# Patient Record
Sex: Male | Born: 1985 | Race: Black or African American | Hispanic: No | Marital: Single | Smoking: Current every day smoker
Health system: Southern US, Community
[De-identification: ages and names within clinical notes are randomized; demographics above are authoritative.]

## PROBLEM LIST (undated history)

## (undated) DIAGNOSIS — K859 Acute pancreatitis without necrosis or infection, unspecified: Secondary | ICD-10-CM

## (undated) DIAGNOSIS — R109 Unspecified abdominal pain: Secondary | ICD-10-CM

## (undated) DIAGNOSIS — Z789 Other specified health status: Secondary | ICD-10-CM

## (undated) DIAGNOSIS — G8929 Other chronic pain: Secondary | ICD-10-CM

## (undated) HISTORY — PX: NO PAST SURGERIES: SHX2092

---

## 2013-07-19 ENCOUNTER — Emergency Department (HOSPITAL_COMMUNITY): Payer: Self-pay

## 2013-07-19 ENCOUNTER — Inpatient Hospital Stay (HOSPITAL_COMMUNITY)
Admission: EM | Admit: 2013-07-19 | Discharge: 2013-07-22 | DRG: 440 | Disposition: A | Discharge status: Home / Self Care | Payer: Self-pay | Attending: Internal Medicine | Admitting: Internal Medicine

## 2013-07-19 ENCOUNTER — Encounter (HOSPITAL_COMMUNITY): Payer: Self-pay | Admitting: Emergency Medicine

## 2013-07-19 DIAGNOSIS — K5900 Constipation, unspecified: Secondary | ICD-10-CM | POA: Diagnosis present

## 2013-07-19 DIAGNOSIS — R7309 Other abnormal glucose: Secondary | ICD-10-CM | POA: Diagnosis present

## 2013-07-19 DIAGNOSIS — IMO0001 Reserved for inherently not codable concepts without codable children: Secondary | ICD-10-CM

## 2013-07-19 DIAGNOSIS — R030 Elevated blood-pressure reading, without diagnosis of hypertension: Secondary | ICD-10-CM | POA: Diagnosis present

## 2013-07-19 DIAGNOSIS — K859 Acute pancreatitis without necrosis or infection, unspecified: Principal | ICD-10-CM

## 2013-07-19 DIAGNOSIS — E860 Dehydration: Secondary | ICD-10-CM | POA: Diagnosis present

## 2013-07-19 DIAGNOSIS — F1010 Alcohol abuse, uncomplicated: Secondary | ICD-10-CM

## 2013-07-19 DIAGNOSIS — R739 Hyperglycemia, unspecified: Secondary | ICD-10-CM

## 2013-07-19 HISTORY — DX: Acute pancreatitis without necrosis or infection, unspecified: K85.90

## 2013-07-19 LAB — RAPID URINE DRUG SCREEN, HOSP PERFORMED
Amphetamines: NOT DETECTED
Benzodiazepines: NOT DETECTED
Cocaine: NOT DETECTED
Opiates: POSITIVE — AB
Tetrahydrocannabinol: POSITIVE — AB

## 2013-07-19 LAB — COMPREHENSIVE METABOLIC PANEL
ALT: 27 U/L (ref 0–53)
AST: 36 U/L (ref 0–37)
Albumin: 4.8 g/dL (ref 3.5–5.2)
CO2: 18 mEq/L — ABNORMAL LOW (ref 19–32)
Calcium: 11 mg/dL — ABNORMAL HIGH (ref 8.4–10.5)
Creatinine, Ser: 1.19 mg/dL (ref 0.50–1.35)
GFR calc non Af Amer: 84 mL/min — ABNORMAL LOW (ref >90)
Glucose, Bld: 153 mg/dL — ABNORMAL HIGH (ref 70–99)
Sodium: 135 mEq/L (ref 135–145)
Total Protein: 9 g/dL — ABNORMAL HIGH (ref 6.0–8.3)

## 2013-07-19 LAB — CBC WITH DIFFERENTIAL/PLATELET
Basophils Absolute: 0 10*3/uL (ref 0.0–0.1)
Basophils Relative: 0 % (ref 0–1)
Eosinophils Absolute: 0 10*3/uL (ref 0.0–0.7)
Eosinophils Relative: 0 % (ref 0–5)
Hemoglobin: 16 g/dL (ref 13.0–17.0)
Lymphocytes Relative: 17 % (ref 12–46)
Lymphs Abs: 2.4 10*3/uL (ref 0.7–4.0)
MCHC: 34 g/dL (ref 30.0–36.0)
MCV: 87.2 fL (ref 78.0–100.0)
Neutro Abs: 11 10*3/uL — ABNORMAL HIGH (ref 1.7–7.7)
Platelets: 430 10*3/uL — ABNORMAL HIGH (ref 150–400)
RDW: 12.1 % (ref 11.5–15.5)
WBC: 14.2 10*3/uL — ABNORMAL HIGH (ref 4.0–10.5)

## 2013-07-19 LAB — ETHANOL: Alcohol, Ethyl (B): <11 mg/dL (ref 0–11)

## 2013-07-19 MED ORDER — POTASSIUM CHLORIDE IN NACL 20-0.9 MEQ/L-% IV SOLN
INTRAVENOUS | Status: DC
  Administered 2013-07-19 – 2013-07-22 (×7): via INTRAVENOUS

## 2013-07-19 MED ORDER — THIAMINE HCL 100 MG/ML IJ SOLN
100.0000 mg | Freq: Every day | INTRAMUSCULAR | Status: DC
  Filled 2013-07-19: qty 2

## 2013-07-19 MED ORDER — SODIUM CHLORIDE 0.9 % IV SOLN
INTRAVENOUS | Status: DC
  Administered 2013-07-19 (×2): via INTRAVENOUS

## 2013-07-19 MED ORDER — SODIUM CHLORIDE 0.9 % IV BOLUS (SEPSIS): 1000.0000 mL | Freq: Once | INTRAVENOUS | Status: AC

## 2013-07-19 MED ORDER — FAMOTIDINE IN NACL 20-0.9 MG/50ML-% IV SOLN
20.0000 mg | Freq: Once | INTRAVENOUS | Status: AC
  Administered 2013-07-19: 20 mg via INTRAVENOUS
  Filled 2013-07-19: qty 50

## 2013-07-19 MED ORDER — SODIUM CHLORIDE 0.9 % IV SOLN
INTRAVENOUS | Status: DC
  Administered 2013-07-19: 20:00:00 via INTRAVENOUS

## 2013-07-19 MED ORDER — SODIUM CHLORIDE 0.9 % IV BOLUS (SEPSIS)
1000.0000 mL | Freq: Once | INTRAVENOUS | Status: AC
  Administered 2013-07-19: 1000 mL via INTRAVENOUS

## 2013-07-19 MED ORDER — SODIUM CHLORIDE 0.9 % IV SOLN: INTRAVENOUS | Status: AC

## 2013-07-19 MED ORDER — ONDANSETRON HCL 4 MG/2ML IJ SOLN
4.0000 mg | INTRAMUSCULAR | Status: DC | PRN
  Administered 2013-07-21: 4 mg via INTRAVENOUS
  Filled 2013-07-19: qty 2

## 2013-07-19 MED ORDER — ONDANSETRON HCL 4 MG/2ML IJ SOLN: 4.0000 mg | Freq: Three times a day (TID) | INTRAMUSCULAR | Status: DC | PRN

## 2013-07-19 MED ORDER — HYDROMORPHONE HCL PF 1 MG/ML IJ SOLN
1.0000 mg | INTRAMUSCULAR | Status: DC | PRN
  Administered 2013-07-19: 2 mg via INTRAVENOUS
  Filled 2013-07-19: qty 2

## 2013-07-19 MED ORDER — HYDROMORPHONE HCL PF 1 MG/ML IJ SOLN
1.0000 mg | INTRAMUSCULAR | Status: DC | PRN
  Administered 2013-07-20 (×2): 2 mg via INTRAVENOUS
  Administered 2013-07-20: 1 mg via INTRAVENOUS
  Administered 2013-07-20: 2 mg via INTRAVENOUS
  Filled 2013-07-19 (×3): qty 2
  Filled 2013-07-19: qty 1

## 2013-07-19 MED ORDER — BISACODYL 10 MG RE SUPP: 10.0000 mg | Freq: Every day | RECTAL | Status: DC | PRN

## 2013-07-19 MED ORDER — PANTOPRAZOLE SODIUM 40 MG IV SOLR
40.0000 mg | Freq: Two times a day (BID) | INTRAVENOUS | Status: DC
  Administered 2013-07-20 – 2013-07-21 (×4): 40 mg via INTRAVENOUS
  Filled 2013-07-19 (×4): qty 40

## 2013-07-19 MED ORDER — HYDROMORPHONE HCL PF 1 MG/ML IJ SOLN: 1.0000 mg | INTRAMUSCULAR | Status: DC | PRN

## 2013-07-19 MED ORDER — MORPHINE SULFATE 4 MG/ML IJ SOLN
4.0000 mg | INTRAMUSCULAR | Status: AC | PRN
  Administered 2013-07-19 (×2): 4 mg via INTRAVENOUS
  Filled 2013-07-19 (×2): qty 1

## 2013-07-19 MED ORDER — OXYCODONE HCL 5 MG PO TABS: 5.0000 mg | ORAL_TABLET | ORAL | Status: DC | PRN

## 2013-07-19 MED ORDER — FLEET ENEMA 7-19 GM/118ML RE ENEM: 1.0000 | ENEMA | Freq: Once | RECTAL | Status: AC | PRN

## 2013-07-19 MED ORDER — ONDANSETRON HCL 4 MG/2ML IJ SOLN
4.0000 mg | INTRAMUSCULAR | Status: DC | PRN
  Administered 2013-07-19: 4 mg via INTRAVENOUS
  Filled 2013-07-19: qty 2

## 2013-07-19 NOTE — ED Provider Notes (Signed)
CSN: [id number]     Arrival date & time 07/19/13  1542 History   First MD Initiated Contact with Patient 07/19/13 1556     Chief Complaint  Patient presents with  . Abdominal Pain    HPI Pt was seen at 1610.  Per pt, c/o gradual onset and persistence of constant upper abd "pain" since yesterday.  Has been associated with multiple intermittent episodes of N/V.  Describes the abd pain as "my pancreatitis." Pt states his pain began after he drank alcohol yesterday. Denies diarrhea, no fevers, no back pain, no rash, no CP/SOB, no black or blood in stools or emesis.       Past Medical History  Diagnosis Date  . Pancreatitis    History reviewed. No pertinent past surgical history.  History  Substance Use Topics  . Smoking status: Never Smoker   . Smokeless tobacco: Not on file  . Alcohol Use: Yes     Comment: last drink yesterday     Review of Systems ROS: Statement: All systems negative except as marked or noted in the HPI; Constitutional: Negative for fever and chills. ; ; Eyes: Negative for eye pain, redness and discharge. ; ; ENMT: Negative for ear pain, hoarseness, nasal congestion, sinus pressure and sore throat. ; ; Cardiovascular: Negative for chest pain, palpitations, diaphoresis, dyspnea and peripheral edema. ; ; Respiratory: Negative for cough, wheezing and stridor. ; ; Gastrointestinal: +N/V, abd pain. Negative for diarrhea, blood in stool, hematemesis, jaundice and rectal bleeding. . ; ; Genitourinary: Negative for dysuria, flank pain and hematuria. ; ; Musculoskeletal: Negative for back pain and neck pain. Negative for swelling and trauma.; ; Skin: Negative for pruritus, rash, abrasions, blisters, bruising and skin lesion.; ; Neuro: Negative for headache, lightheadedness and neck stiffness. Negative for weakness, altered level of consciousness , altered mental status, extremity weakness, paresthesias, involuntary movement, seizure and syncope.       Allergies  Review of  patient's allergies indicates no known allergies.  Home Medications   Current Outpatient Rx  Name  Route  Sig  Dispense  Refill  . bismuth subsalicylate (PEPTO BISMOL) 262 MG chewable tablet   Oral   Chew 524 mg by mouth as needed for indigestion or diarrhea or loose stools.         . dicyclomine (BENTYL) 10 MG capsule   Oral   Take 10 mg by mouth 4 (four) times daily as needed for spasms.         Marland Kitchen loperamide (IMODIUM A-D) 2 MG tablet   Oral   Take 2 mg by mouth 4 (four) times daily as needed for diarrhea or loose stools.         . promethazine (PHENERGAN) 25 MG tablet   Oral   Take 25 mg by mouth every 6 (six) hours as needed for nausea or vomiting.          BP 156/110  Pulse 94  Temp(Src) 97.8 F (36.6 C) (Oral)  Resp 24  Ht 5\' 4"  (1.626 m)  Wt 150 lb (68.04 kg)  BMI 25.73 kg/m2  SpO2 100% Physical Exam 1615: Physical examination:  Nursing notes reviewed; Vital signs and O2 SAT reviewed;  Constitutional: Well developed, Well nourished, Uncomfortable appearing.; Head:  Normocephalic, atraumatic; Eyes: EOMI, PERRL, No scleral icterus; ENMT: Mouth and pharynx normal, Mucous membranes dry; Neck: Supple, Full range of motion, No lymphadenopathy; Cardiovascular: Regular rate and rhythm, No gallop; Respiratory: Breath sounds clear & equal bilaterally, No wheezes.  Speaking full  sentences with ease, Normal respiratory effort/excursion; Chest: Nontender, Movement normal; Abdomen: Soft, +mid-epigastric area tender to palp. No rebound or guarding. Nondistended, Normal bowel sounds; Genitourinary: No CVA tenderness; Extremities: Pulses normal, No tenderness, No edema, No calf edema or asymmetry.; Neuro: AA&Ox3, Major CN grossly intact.  Speech clear. No gross focal motor or sensory deficits in extremities.; Skin: Color normal, Warm, Dry.   ED Course  Procedures   EKG Interpretation   None       MDM  MDM Reviewed: nursing note and vitals Interpretation: labs and  x-ray     Results for orders placed during the hospital encounter of 07/19/13  CBC WITH DIFFERENTIAL      Result Value Range   WBC 14.2 (*) 4.0 - 10.5 K/uL   RBC 5.40  4.22 - 5.81 MIL/uL   Hemoglobin 16.0  13.0 - 17.0 g/dL   HCT 14.7  82.9 - 56.2 %   MCV 87.2  78.0 - 100.0 fL   MCH 29.6  26.0 - 34.0 pg   MCHC 34.0  30.0 - 36.0 g/dL   RDW 13.0  86.5 - 78.4 %   Platelets 430 (*) 150 - 400 K/uL   Neutrophils Relative % 77  43 - 77 %   Neutro Abs 11.0 (*) 1.7 - 7.7 K/uL   Lymphocytes Relative 17  12 - 46 %   Lymphs Abs 2.4  0.7 - 4.0 K/uL   Monocytes Relative 6  3 - 12 %   Monocytes Absolute 0.8  0.1 - 1.0 K/uL   Eosinophils Relative 0  0 - 5 %   Eosinophils Absolute 0.0  0.0 - 0.7 K/uL   Basophils Relative 0  0 - 1 %   Basophils Absolute 0.0  0.0 - 0.1 K/uL  COMPREHENSIVE METABOLIC PANEL      Result Value Range   Sodium 135  135 - 145 mEq/L   Potassium 4.1  3.5 - 5.1 mEq/L   Chloride 95 (*) 96 - 112 mEq/L   CO2 18 (*) 19 - 32 mEq/L   Glucose, Bld 153 (*) 70 - 99 mg/dL   BUN 9  6 - 23 mg/dL   Creatinine, Ser 6.96  0.50 - 1.35 mg/dL   Calcium 29.5 (*) 8.4 - 10.5 mg/dL   Total Protein 9.0 (*) 6.0 - 8.3 g/dL   Albumin 4.8  3.5 - 5.2 g/dL   AST 36  0 - 37 U/L   ALT 27  0 - 53 U/L   Alkaline Phosphatase 150 (*) 39 - 117 U/L   Total Bilirubin 1.4 (*) 0.3 - 1.2 mg/dL   GFR calc non Af Amer 84 (*) >90 mL/min   GFR calc Af Amer >90  >90 mL/min  LIPASE, BLOOD      Result Value Range   Lipase 1578 (*) 11 - 59 U/L  ETHANOL      Result Value Range   Alcohol, Ethyl (B) <11  0 - 11 mg/dL   Dg Abd Acute W/chest 07/19/2013   CLINICAL DATA:  Severe abdominal pain  EXAM: ACUTE ABDOMEN SERIES (ABDOMEN 2 VIEW & CHEST 1 VIEW)  COMPARISON:  None.  FINDINGS: Normal heart size.  Clear lungs.  There is no free intraperitoneal gas. Nonspecific air-fluid levels are present in the colon. No disproportionate dilatation of small bowel.  IMPRESSION: No active cardiopulmonary disease. Nonobstructive  bowel gas pattern.   Electronically Signed   By: Maryclare Bean M.D.   On: 07/19/2013 16:40    1725:  Lipase elevated, c/w acute pancreatitis. Dx and testing d/w pt and family.  Questions answered.  Verb understanding, agreeable to observation admit. T/C to [facility] Irene Limbo, case discussed, including:  HPI, pertinent PM/SHx, VS/PE, dx testing, ED course and treatment:  Agreeable to admit, requests to write temporary orders, obtain medical bed to team 1.    Laray Anger, DO 07/20/13 [phone]

## 2013-07-19 NOTE — ED Notes (Signed)
Pt says is unable to give urine sample at this time.  Instructed to notify staff when felt like could give specimen.

## 2013-07-19 NOTE — H&P (Signed)
[facility] History and Physical  Jeff Watts  AVW:[id number]  DOB: Apr 02, 1986   DOA: 07/19/2013   PCP:   No PCP Per Patient   Chief Complaint:  Abdominal pain and vomiting since yesterday  HPI: Jeff Watts is a 26 y.o. male.  With a past history of alcoholic pancreatitis, and drank some alcohol after coming home from work yesterday morning, and woke up later in the afternoon vomiting with epigastric pain. Initially vomitus contained bilious material but today there is been streaks of blood. He was seen at [facility] emergency room yesterday and given narcotic pain. He was reevaluated at [facility] emergency room this afternoon and blood lipase was greater than 1500.  Initially admitted to 3-9 beers per week, but later admitted it was more like 12-20. Essentially has planned to make his New Year's resolution to quit alcohol    Rewiew of Systems:   All systems negative except as marked bold or noted in the HPI;  Constitutional:    malaise, fever and chills. ;  Eyes:   eye pain, redness and discharge. ;  ENMT:   ear pain, hoarseness, nasal congestion, sinus pressure and sore throat. ;  Cardiovascular:    chest pain, palpitations, diaphoresis, dyspnea and peripheral edema.  Respiratory:   cough, hemoptysis, wheezing and stridor. ;  Gastrointestinal:  nausea, vomiting x6 , diarrhea, constipation, abdominal pain, melena, blood in stool, hematemesis, jaundice and rectal bleeding. unusual weight loss..   Genitourinary:    frequency, dysuria, incontinence,flank pain and hematuria; Musculoskeletal:   back pain and neck pain.  swelling and trauma.;  Skin: .  pruritus, rash, abrasions, bruising and skin lesion.; ulcerations Neuro:    headache, lightheadedness and neck stiffness.  weakness, altered level of consciousness, altered mental status, extremity weakness, burning feet, involuntary movement, seizure and syncope.  Psych:    anxiety, depression, insomnia, tearfulness, panic attacks,  hallucinations, paranoia, suicidal or homicidal ideation    Past Medical History  Diagnosis Date  . Pancreatitis     History reviewed. No pertinent past surgical history.  Medications:  HOME MEDS: Prior to Admission medications   Medication Sig Start Date End Date Taking? Authorizing Provider  bismuth subsalicylate (PEPTO BISMOL) 262 MG chewable tablet Chew 524 mg by mouth as needed for indigestion or diarrhea or loose stools.   Yes Historical Provider, MD  dicyclomine (BENTYL) 10 MG capsule Take 10 mg by mouth 4 (four) times daily as needed for spasms.   Yes Historical Provider, MD  loperamide (IMODIUM A-D) 2 MG tablet Take 2 mg by mouth 4 (four) times daily as needed for diarrhea or loose stools.   Yes Historical Provider, MD  promethazine (PHENERGAN) 25 MG tablet Take 25 mg by mouth every 6 (six) hours as needed for nausea or vomiting.   Yes Historical Provider, MD     Allergies:  No Known Allergies  Social History:   reports that he has never smoked. He does not have any smokeless tobacco history on file. He reports that he drinks alcohol. He reports that he does not use illicit drugs.  Family History: No family history on file.   Physical Exam: Filed Vitals:   07/19/13 1553 07/19/13 1821  BP: 156/110 151/107  Pulse: 94 92  Temp: 97.8 F (36.6 C)   TempSrc: Oral   Resp: 24 14  Height: 5\' 4"  (1.626 m)   Weight: 68.04 kg (150 lb)   SpO2: 100% 98%   Blood pressure 151/107, pulse 92, temperature 97.8 F (36.6 C), temperature  source Oral, resp. rate 14, height 5\' 4"  (1.626 m), weight 68.04 kg (150 lb), SpO2 98.00%. Body mass index is 25.73 kg/(m^2).   GEN:  Pleasant young African American man lying bed in no acute distress; cooperative with exam PSYCH:  alert and oriented x4;  anxious nor depressed; affect is appropriate. HEENT: Mucous membranes pink DRY and anicteric; PERRLA; EOM intact; no cervical lymphadenopathy nor thyromegaly or carotid bruit; no  JVD; Breasts:: Not examined CHEST WALL: No tenderness CHEST: Normal respiration, clear to auscultation bilaterally HEART: Regular rate and rhythm; no murmurs rubs or gallops BACK: No kyphosis no scoliosis; no CVA tenderness ABDOMEN: Obese, no superficial tenderness; no masses, no organomegaly,  no pannus; no intertriginous candida. Rectal Exam: Not done EXTREMITIES: No bone or joint deformity;  no edema; no ulcerations. Genitalia: not examined PULSES: 2+ and symmetric SKIN: Normal hydration no rash or ulceration CNS: Cranial nerves 2-12 grossly intact no focal lateralizing neurologic deficit   Labs on Admission:  Basic Metabolic Panel:  Recent Labs Lab 07/19/13 1625  NA 135  K 4.1  CL 95*  CO2 18*  GLUCOSE 153*  BUN 9  CREATININE 1.19  CALCIUM 11.0*   Liver Function Tests:  Recent Labs Lab 07/19/13 1625  AST 36  ALT 27  ALKPHOS 150*  BILITOT 1.4*  PROT 9.0*  ALBUMIN 4.8    Recent Labs Lab 07/19/13 1625  LIPASE 1578*   No results found for this basename: AMMONIA,  in the last 168 hours CBC:  Recent Labs Lab 07/19/13 1625  WBC 14.2*  NEUTROABS 11.0*  HGB 16.0  HCT 47.1  MCV 87.2  PLT 430*   Cardiac Enzymes: No results found for this basename: CKTOTAL, CKMB, CKMBINDEX, TROPONINI,  in the last 168 hours BNP: No components found with this basename: POCBNP,  D-dimer: No components found with this basename: D-DIMER,  CBG: No results found for this basename: GLUCAP,  in the last 168 hours  Radiological Exams on Admission: Dg Abd Acute W/chest  07/19/2013   CLINICAL DATA:  Severe abdominal pain  EXAM: ACUTE ABDOMEN SERIES (ABDOMEN 2 VIEW & CHEST 1 VIEW)  COMPARISON:  None.  FINDINGS: Normal heart size.  Clear lungs.  There is no free intraperitoneal gas. Nonspecific air-fluid levels are present in the colon. No disproportionate dilatation of small bowel.  IMPRESSION: No active cardiopulmonary disease. Nonobstructive bowel gas pattern.   Electronically  Signed   By: Maryclare Bean M.D.   On: 07/19/2013 16:40       Assessment/Plan   Active Problems:   Acute pancreatitis   Elevated blood pressure   Hyperglycemia   Dehydration   Alcohol abuse  PLAN: IV fluids and pain control Check hemoglobin A1c Counsel on the importance of alcohol cessation IV thiamine Monitor blood pressure when pain is controlled  Other plans as per orders.  Code Status: Full *  Jeff Watts Nocturnist [facility] Pager [phone]   07/19/2013, 8:51 PM

## 2013-07-19 NOTE — ED Notes (Addendum)
Pt c/o epigastric pain with n/v that started last night, has hx of pancreatitis, admits to drinking alcohol yesterday,

## 2013-07-20 LAB — URINE MICROSCOPIC-ADD ON

## 2013-07-20 LAB — CBC
HCT: 41 % (ref 39.0–52.0)
Hemoglobin: 13.1 g/dL (ref 13.0–17.0)
MCH: 28.8 pg (ref 26.0–34.0)
MCV: 90.1 fL (ref 78.0–100.0)
Platelets: 370 10*3/uL (ref 150–400)
RBC: 4.55 MIL/uL (ref 4.22–5.81)
WBC: 14.6 10*3/uL — ABNORMAL HIGH (ref 4.0–10.5)

## 2013-07-20 LAB — URINALYSIS, ROUTINE W REFLEX MICROSCOPIC
Bilirubin Urine: NEGATIVE
Glucose, UA: NEGATIVE mg/dL
Leukocytes, UA: NEGATIVE
Specific Gravity, Urine: >1.03 — ABNORMAL HIGH (ref 1.005–1.030)
Urobilinogen, UA: 0.2 mg/dL (ref 0.0–1.0)
pH: 6 (ref 5.0–8.0)

## 2013-07-20 LAB — COMPREHENSIVE METABOLIC PANEL
ALT: 18 U/L (ref 0–53)
CO2: 24 mEq/L (ref 19–32)
Calcium: 9.2 mg/dL (ref 8.4–10.5)
Chloride: 102 mEq/L (ref 96–112)
Creatinine, Ser: 1.05 mg/dL (ref 0.50–1.35)
GFR calc Af Amer: >90 mL/min (ref >90)
GFR calc non Af Amer: >90 mL/min (ref >90)
Glucose, Bld: 119 mg/dL — ABNORMAL HIGH (ref 70–99)
Potassium: 3.9 mEq/L (ref 3.5–5.1)
Sodium: 136 mEq/L (ref 135–145)
Total Bilirubin: 0.9 mg/dL (ref 0.3–1.2)

## 2013-07-20 LAB — HEMOGLOBIN A1C: Mean Plasma Glucose: 105 mg/dL (ref <117)

## 2013-07-20 MED ORDER — LORAZEPAM 2 MG/ML IJ SOLN: 1.0000 mg | Freq: Four times a day (QID) | INTRAMUSCULAR | Status: DC | PRN

## 2013-07-20 MED ORDER — VITAMIN B-1 100 MG PO TABS
100.0000 mg | ORAL_TABLET | Freq: Every day | ORAL | Status: DC
  Administered 2013-07-21 – 2013-07-22 (×2): 100 mg via ORAL
  Filled 2013-07-20 (×2): qty 1

## 2013-07-20 MED ORDER — OXYCODONE HCL 5 MG PO TABS
5.0000 mg | ORAL_TABLET | ORAL | Status: DC | PRN
  Administered 2013-07-20 – 2013-07-21 (×4): 5 mg via ORAL
  Filled 2013-07-20 (×4): qty 1

## 2013-07-20 MED ORDER — HYDRALAZINE HCL 20 MG/ML IJ SOLN
10.0000 mg | INTRAMUSCULAR | Status: DC | PRN
  Administered 2013-07-20 – 2013-07-21 (×2): 10 mg via INTRAVENOUS
  Filled 2013-07-20 (×2): qty 1

## 2013-07-20 MED ORDER — HYDRALAZINE HCL 20 MG/ML IJ SOLN: 10.0000 mg | INTRAMUSCULAR | Status: DC | PRN

## 2013-07-20 MED ORDER — LORAZEPAM 1 MG PO TABS: 1.0000 mg | ORAL_TABLET | Freq: Four times a day (QID) | ORAL | Status: DC | PRN

## 2013-07-20 MED ORDER — FOLIC ACID 1 MG PO TABS
1.0000 mg | ORAL_TABLET | Freq: Every day | ORAL | Status: DC
  Administered 2013-07-20 – 2013-07-22 (×3): 1 mg via ORAL
  Filled 2013-07-20 (×3): qty 1

## 2013-07-20 MED ORDER — ADULT MULTIVITAMIN W/MINERALS CH
1.0000 | ORAL_TABLET | Freq: Every day | ORAL | Status: DC
  Administered 2013-07-20 – 2013-07-22 (×3): 1 via ORAL
  Filled 2013-07-20 (×3): qty 1

## 2013-07-20 NOTE — Progress Notes (Signed)
UR completed.  Patient changed to inpatient status r/t continuing to require IVF, IV pain meds, and IV antiemetics.

## 2013-07-20 NOTE — Progress Notes (Signed)
[facility] PROGRESS NOTE  S'von Sabol WUJ:[id number] DOB: Sep 10, 1985 DOA: 07/19/2013 PCP: No PCP Per Patient  Assessment/Plan: Acute pancreatitis: hx of same. Last hospitalized at [facility] this time last year. Reports some improvement in pain. Continue vigorous IV hydration. Continue dilaudid and zofran as needed. NPO status. Recheck lipase in am.   Elevated blood pressure: denies hx of same. Likely related to pain. Will adjust medication for improved pain control. Hydralazine prn.  Continue to monitor.   Hyperglycemia: denies hx. Trending down this am. Await A1c. Monitor  Dehydration: likely related to #1.  improving. Continue vigorous IV fluids.    Alcohol abuse: reports only 2-3 beers daily. Counseled regarding ETOH abuse.   Leukocytosis: related to #1 and reactive. Patient is afebrile and non-toxic. Will monitor closely  Code Status: full Family Communication: none present Disposition Plan: home when ready likely 2 days or so   Consultants:  none  Procedures:  none  Antibiotics:  none  HPI/Subjective: Lying in bed watching TV. Reports some improvement in pain but that pain medicine "not lasting quite long enough".   Objective: Filed Vitals:   07/20/13 0500  BP: 149/101  Pulse: 82  Temp: 98.2 F (36.8 C)  Resp:     Intake/Output Summary (Last 24 hours) at 07/20/13 1050 Last data filed at 07/20/13 0600  Gross per 24 hour  Intake  962.5 ml  Output      0 ml  Net  962.5 ml   Filed Weights   07/19/13 1553 07/19/13 2100 07/20/13 0500  Weight: 68.04 kg (150 lb) 65.6 kg (144 lb 10 oz) 65.6 kg (144 lb 10 oz)    Exam:   General:  Well nourished. NAD  Cardiovascular: RRR ?murmur, no gallup or rub. No LE edema  Respiratory: normal effort BS clear bilaterally no wheeze no rhonchi  Abdomen: distended somewhat, diffuse tenderness to palpation. BS sluggish  Musculoskeletal: no clubbing no cyanosis  Data Reviewed: Basic Metabolic Panel:  Recent  Labs Lab 07/19/13 1625 07/20/13 0529  NA 135 136  K 4.1 3.9  CL 95* 102  CO2 18* 24  GLUCOSE 153* 119*  BUN 9 6  CREATININE 1.19 1.05  CALCIUM 11.0* 9.2  MG 2.1  --    Liver Function Tests:  Recent Labs Lab 07/19/13 1625 07/20/13 0529  AST 36 26  ALT 27 18  ALKPHOS 150* 112  BILITOT 1.4* 0.9  PROT 9.0* 7.0  ALBUMIN 4.8 3.6    Recent Labs Lab 07/19/13 1625  LIPASE 1578*   No results found for this basename: AMMONIA,  in the last 168 hours CBC:  Recent Labs Lab 07/19/13 1625 07/20/13 0529  WBC 14.2* 14.6*  NEUTROABS 11.0*  --   HGB 16.0 13.1  HCT 47.1 41.0  MCV 87.2 90.1  PLT 430* 370   Cardiac Enzymes: No results found for this basename: CKTOTAL, CKMB, CKMBINDEX, TROPONINI,  in the last 168 hours BNP (last 3 results) No results found for this basename: PROBNP,  in the last 8760 hours CBG: No results found for this basename: GLUCAP,  in the last 168 hours  No results found for this or any previous visit (from the past 240 hour(s)).   Studies: Dg Abd Acute W/chest  07/19/2013   CLINICAL DATA:  Severe abdominal pain  EXAM: ACUTE ABDOMEN SERIES (ABDOMEN 2 VIEW & CHEST 1 VIEW)  COMPARISON:  None.  FINDINGS: Normal heart size.  Clear lungs.  There is no free intraperitoneal gas. Nonspecific air-fluid levels are present in  the colon. No disproportionate dilatation of small bowel.  IMPRESSION: No active cardiopulmonary disease. Nonobstructive bowel gas pattern.   Electronically Signed   By: Maryclare Bean M.D.   On: 07/19/2013 16:40    Scheduled Meds: . pantoprazole (PROTONIX) IV  40 mg Intravenous Q12H  . thiamine  100 mg Intravenous Daily   Continuous Infusions: . 0.9 % NaCl with KCl 20 mEq / L 150 mL/hr at 07/20/13 1096    Active Problems:   Acute pancreatitis   Elevated blood pressure   Hyperglycemia   Dehydration   Alcohol abuse    Time spent: 30 minutes    [facility]  [facility] Pager [phone]. If 7PM-7AM, please contact  night-coverage at [email or web address], password [facility] 07/20/2013, 10:50 AM  LOS: 1 day

## 2013-07-20 NOTE — Progress Notes (Signed)
Patient seen, independently examined and chart reviewed. I agree with exam, assessment and plan discussed with Toya Smothers, NP.  27 year old man with history of alcoholic pancreatitis, presented emergency department with abdominal pain after consuming alcohol. Initial evaluation suggested acute pancreatitis. He has a history of heavy alcohol use.  He feels somewhat better today. As him abdominal pain. Afebrile, hypertensive, vital signs stable. He appears calm, mildly uncomfortable. Nontoxic. Abdomen soft, nondistended, moderate epigastric tenderness. Complete metabolic panel unremarkable. Calcium remains normal. Mild leukocytosis without change. Urinalysis unremarkable. Urine drug screen positive for opiates and marijuana.  Continue treatment for acute pancreatitis with supportive care, repeat laboratory studies in the morning. Monitor  elevated blood pressure. CIWA and monitor closely for alcohol withdrawal.   Brendia Sacks, MD [facility] [phone]

## 2013-07-21 LAB — BASIC METABOLIC PANEL
CO2: 24 mEq/L (ref 19–32)
Calcium: 9.3 mg/dL (ref 8.4–10.5)
Creatinine, Ser: 0.81 mg/dL (ref 0.50–1.35)
GFR calc non Af Amer: >90 mL/min (ref >90)
Glucose, Bld: 76 mg/dL (ref 70–99)

## 2013-07-21 LAB — CBC
Hemoglobin: 12.7 g/dL — ABNORMAL LOW (ref 13.0–17.0)
MCH: 29.5 pg (ref 26.0–34.0)
MCHC: 33.2 g/dL (ref 30.0–36.0)
MCV: 89.1 fL (ref 78.0–100.0)
RBC: 4.3 MIL/uL (ref 4.22–5.81)

## 2013-07-21 LAB — LIPASE, BLOOD: Lipase: 718 U/L — ABNORMAL HIGH (ref 11–59)

## 2013-07-21 MED ORDER — PANTOPRAZOLE SODIUM 40 MG PO TBEC
40.0000 mg | DELAYED_RELEASE_TABLET | Freq: Two times a day (BID) | ORAL | Status: DC
  Administered 2013-07-21 – 2013-07-22 (×2): 40 mg via ORAL
  Filled 2013-07-21 (×2): qty 1

## 2013-07-21 MED ORDER — DOCUSATE SODIUM 100 MG PO CAPS
100.0000 mg | ORAL_CAPSULE | Freq: Two times a day (BID) | ORAL | Status: DC
  Administered 2013-07-21 – 2013-07-22 (×3): 100 mg via ORAL
  Filled 2013-07-21 (×3): qty 1

## 2013-07-21 MED ORDER — POLYETHYLENE GLYCOL 3350 17 G PO PACK
17.0000 g | PACK | Freq: Every day | ORAL | Status: DC
  Administered 2013-07-21: 17 g via ORAL
  Filled 2013-07-21 (×2): qty 1

## 2013-07-21 NOTE — Progress Notes (Signed)
Patient seen, independently examined and chart reviewed. I agree with exam, assessment and plan discussed with Toya Smothers, NP.  He feels better today. Less abdominal pain. No nausea or vomiting. Tolerating liquids. Complains of constipation.  Afebrile, vital signs stable. Elevated blood pressures resolving. He appears calm and comfortable. Abdomen is soft. Exam is benign.   Lipase trending down rapidly, 718. Leukocytosis trending downward. Hemoglobin trending downward slightly. Calcium normal.  Continue supportive care, treatment of acute pancreatitis. Appears to be improving clinically. Advance to full liquids and monitor. Repeat laboratory studies in the morning. Likely home 48 hours.  Brendia Sacks, MD [facility] [phone]

## 2013-07-21 NOTE — Progress Notes (Signed)
[facility] PROGRESS NOTE  Jeff Watts:[id number] DOB: 05-05-1986 DOA: 07/19/2013 PCP: No PCP Per Patient  Assessment/Plan:  Acute pancreatitis: hx of same related to ETOH. Much improved this am. Lipase trending down.  Will advance diet to full liquids as pt reports feeling hungry.  Continue IV hydration at lower rate. Discontinue IV dilaudid and transition to po pain med. zofran as needed.  Recheck lipase in am.   Elevated blood pressure: denies hx of same. Some improvement with pain control.  Continue to monitor.   Hyperglycemia: denies hx. Resolved this am.   A1c 5.3.    Dehydration: likely related to #1. Resolving. IV fluids at lower rate and full liquid diet.   Alcohol abuse: reports only 2-3 beers daily. Counseled regarding ETOH abuse.   Leukocytosis: related to #1 and reactive. Trending downward.  Patient remains afebrile and non-toxic. Will monitor closely     Code Status: full Family Communication: none present Disposition Plan: hopefully home tomorrow   Consultants:  none  Procedures:  none  Antibiotics:  none  HPI/Subjective: Reports feeling hungry and requesting food.   Objective: Filed Vitals:   07/21/13 0617  BP: 149/94  Pulse: 84  Temp: 99 F (37.2 C)  Resp: 18    Intake/Output Summary (Last 24 hours) at 07/21/13 1300 Last data filed at 07/21/13 1128  Gross per 24 hour  Intake 4392.5 ml  Output   2250 ml  Net 2142.5 ml   Filed Weights   07/19/13 2100 07/20/13 0500 07/21/13 0617  Weight: 65.6 kg (144 lb 10 oz) 65.6 kg (144 lb 10 oz) 66.8 kg (147 lb 4.3 oz)    Exam:   General:  Well nourished NAD  Cardiovascular: RRR No LE edema  Respiratory: normal effort BS clear bilaterally no wheeze   Abdomen: round soft +BS mild diffuse tenderness  Musculoskeletal: good muscle tone  Data Reviewed: Basic Metabolic Panel:  Recent Labs Lab 07/19/13 1625 07/20/13 0529 07/21/13 0606  NA 135 136 132*  K 4.1 3.9 3.6  CL 95*  102 97  CO2 18* 24 24  GLUCOSE 153* 119* 76  BUN 9 6 4*  CREATININE 1.19 1.05 0.81  CALCIUM 11.0* 9.2 9.3  MG 2.1  --   --    Liver Function Tests:  Recent Labs Lab 07/19/13 1625 07/20/13 0529  AST 36 26  ALT 27 18  ALKPHOS 150* 112  BILITOT 1.4* 0.9  PROT 9.0* 7.0  ALBUMIN 4.8 3.6    Recent Labs Lab 07/19/13 1625 07/21/13 0606  LIPASE 1578* 718*   No results found for this basename: AMMONIA,  in the last 168 hours CBC:  Recent Labs Lab 07/19/13 1625 07/20/13 0529 07/21/13 0606  WBC 14.2* 14.6* 12.6*  NEUTROABS 11.0*  --   --   HGB 16.0 13.1 12.7*  HCT 47.1 41.0 38.3*  MCV 87.2 90.1 89.1  PLT 430* 370 317   Cardiac Enzymes: No results found for this basename: CKTOTAL, CKMB, CKMBINDEX, TROPONINI,  in the last 168 hours BNP (last 3 results) No results found for this basename: PROBNP,  in the last 8760 hours CBG: No results found for this basename: GLUCAP,  in the last 168 hours  No results found for this or any previous visit (from the past 240 hour(s)).   Studies: Dg Abd Acute W/chest  07/19/2013   CLINICAL DATA:  Severe abdominal pain  EXAM: ACUTE ABDOMEN SERIES (ABDOMEN 2 VIEW & CHEST 1 VIEW)  COMPARISON:  None.  FINDINGS: Normal heart  size.  Clear lungs.  There is no free intraperitoneal gas. Nonspecific air-fluid levels are present in the colon. No disproportionate dilatation of small bowel.  IMPRESSION: No active cardiopulmonary disease. Nonobstructive bowel gas pattern.   Electronically Signed   By: Jeff Watts M.D.   On: 07/19/2013 16:40    Scheduled Meds: . folic acid  1 mg Oral Daily  . multivitamin with minerals  1 tablet Oral Daily  . pantoprazole (PROTONIX) IV  40 mg Intravenous Q12H  . thiamine  100 mg Oral Daily   Continuous Infusions: . 0.9 % NaCl with KCl 20 mEq / L 50 mL/hr at 07/21/13 1117    Principal Problem:   Acute pancreatitis Active Problems:   Elevated blood pressure   Hyperglycemia   Dehydration   Alcohol  abuse    Time spent: 30 minutes    Jeff Watts  [facility] Pager [phone] 7PM-7AM, please contact night-coverage at [email or web address], password [name] 07/21/2013, 1:00 PM  LOS: 2 days

## 2013-07-21 NOTE — Progress Notes (Signed)
The patient is receiving Protonix by the intravenous route.  Based on criteria approved by the Pharmacy and Therapeutics Committee and the Medical Executive Committee, the medication is being converted to the equivalent oral dose form.  These criteria include: -No Active GI bleeding -Able to tolerate diet of full liquids (or better) or tube feeding OR able to tolerate other medications by the oral or enteral route  If you have any questions about this conversion, please contact the Pharmacy Department (ext [phone]).  Thank you.  Wayland Denis, [facility] 07/21/2013 2:22 PM

## 2013-07-22 LAB — CBC
HCT: 37.4 % — ABNORMAL LOW (ref 39.0–52.0)
Hemoglobin: 12.2 g/dL — ABNORMAL LOW (ref 13.0–17.0)
MCH: 28.8 pg (ref 26.0–34.0)
MCHC: 32.6 g/dL (ref 30.0–36.0)
MCV: 88.4 fL (ref 78.0–100.0)
RDW: 11.8 % (ref 11.5–15.5)
WBC: 11.5 10*3/uL — ABNORMAL HIGH (ref 4.0–10.5)

## 2013-07-22 LAB — BASIC METABOLIC PANEL
BUN: 6 mg/dL (ref 6–23)
Calcium: 9.7 mg/dL (ref 8.4–10.5)
Chloride: 98 mEq/L (ref 96–112)
Creatinine, Ser: 0.8 mg/dL (ref 0.50–1.35)
GFR calc Af Amer: >90 mL/min (ref >90)
GFR calc non Af Amer: >90 mL/min (ref >90)

## 2013-07-22 NOTE — Plan of Care (Signed)
Problem: Discharge Progression Outcomes Goal: Pain controlled with appropriate interventions Outcome: Completed/Met Date Met:  07/22/13 Pain 0

## 2013-07-22 NOTE — Discharge Summary (Signed)
Physician Discharge Summary  Salvator Seppala ZOX:[id number] DOB: [birth year] DOA: 07/19/2013  PCP: No PCP Per Patient  Admit date: 07/19/2013 Discharge date: 07/22/2013  Time spent: 45 minutes  Recommendations for Outpatient Follow-up:  1. PCP 1 week for evaluation of symptoms and trending of BP  Discharge Diagnoses:  Principal Problem:   Acute pancreatitis Active Problems:   Elevated blood pressure   Hyperglycemia   Dehydration   Alcohol abuse   Discharge Condition: stable  Diet recommendation: low fat  Filed Weights   07/20/13 0500 07/21/13 0617 07/22/13 0500  Weight: 65.6 kg (144 lb 10 oz) 66.8 kg (147 lb 4.3 oz) 64.7 kg (142 lb 10.2 oz)    History of present illness:  Jeff Watts is a 27 y.o. male. With a past history of alcoholic pancreatitis,who drank some alcohol after coming home from work 07/18/13 and woke up later in the afternoon vomiting with epigastric pain. Initially vomitus contained bilious material but developed streaks of blood. He was seen at [facility] emergency room 07/18/13 and given narcotics for pain. He was reevaluated at [facility] emergency room 07/19/13 for worsening pain and  blood lipase was greater than 1500.  Initially admitted to 3-9 beers per week, but later admitted it was more like 12-20.  Essentially has planned to make his New Year's resolution to quit alcohol      Hospital Course:  Acute pancreatitis: hx of same related to ETOH. Admitted to floor. Provided with support therapy such as vigorous IV fluids, pain medicine and anti-emetic. Quickly improved. Lipase trending down at discharge. Advance diet steadily and at discharge patient tolerating low fat diet. He is pain free with benign exam.    Elevated blood pressure: denied hx of same. Likely related to pain. At discharge much improved. Recommend OP follow up with PCP for trending of BP.   Hyperglycemia: denies hx. Resolved at discharge.  A1c 5.3.   Dehydration: likely related to #1. IV  fluids provided. Resolved at discharge.    Alcohol abuse: reports only 12-30 beers daily. Counseled regarding ETOH abuse.   Leukocytosis: related to #1 and reactive. Trending downward. Patient remained afebrile and non-toxic.     Procedures:  none  Consultations:  none  Discharge Exam: Filed Vitals:   07/22/13 0500  BP: 146/87  Pulse: 80  Temp: 98 F (36.7 C)  Resp: 17    General: well nourished. Calm comfortable Cardiovascular: RRR No MGR No LE edema Respiratory: normal effort BS clear bilaterally no wheeze Abdomen: soft +BS throughout. Non tender to palpation  Discharge Instructions     Medication List         bismuth subsalicylate 262 MG chewable tablet  Commonly known as:  PEPTO BISMOL  Chew 524 mg by mouth as needed for indigestion or diarrhea or loose stools.     dicyclomine 10 MG capsule  Commonly known as:  BENTYL  Take 10 mg by mouth 4 (four) times daily as needed for spasms.     loperamide 2 MG tablet  Commonly known as:  IMODIUM A-D  Take 2 mg by mouth 4 (four) times daily as needed for diarrhea or loose stools.     promethazine 25 MG tablet  Commonly known as:  PHENERGAN  Take 25 mg by mouth every 6 (six) hours as needed for nausea or vomiting.       No Known Allergies     Follow-up Information   Follow up with No PCP Per Patient.   Specialty:  General Practice  Contact information:   [name] [place] [address] [phone]       Follow up with No PCP Per Patient.   Specialty:  General Practice   Contact information:   [address] [place] [address] [phone]        The results of significant diagnostics from this hospitalization (including imaging, microbiology, ancillary and laboratory) are listed below for reference.    Significant Diagnostic Studies: Dg Abd Acute W/chest  07/19/2013   CLINICAL DATA:  Severe abdominal pain  EXAM: ACUTE ABDOMEN SERIES (ABDOMEN 2 VIEW & CHEST 1 VIEW)  COMPARISON:   None.  FINDINGS: Normal heart size.  Clear lungs.  There is no free intraperitoneal gas. Nonspecific air-fluid levels are present in the colon. No disproportionate dilatation of small bowel.  IMPRESSION: No active cardiopulmonary disease. Nonobstructive bowel gas pattern.   Electronically Signed   By: Maryclare Bean M.D.   On: 07/19/2013 16:40    Microbiology: No results found for this or any previous visit (from the past 240 hour(s)).   Labs: Basic Metabolic Panel:  Recent Labs Lab 07/19/13 1625 07/20/13 0529 07/21/13 0606 07/22/13 0556  NA 135 136 132* 136  K 4.1 3.9 3.6 3.6  CL 95* 102 97 98  CO2 18* 24 24 25   GLUCOSE 153* 119* 76 81  BUN 9 6 4* 6  CREATININE 1.19 1.05 0.81 0.80  CALCIUM 11.0* 9.2 9.3 9.7  MG 2.1  --   --   --    Liver Function Tests:  Recent Labs Lab 07/19/13 1625 07/20/13 0529  AST 36 26  ALT 27 18  ALKPHOS 150* 112  BILITOT 1.4* 0.9  PROT 9.0* 7.0  ALBUMIN 4.8 3.6    Recent Labs Lab 07/19/13 1625 07/21/13 0606 07/22/13 0556  LIPASE 1578* 718* 428*   No results found for this basename: AMMONIA,  in the last 168 hours CBC:  Recent Labs Lab 07/19/13 1625 07/20/13 0529 07/21/13 0606 07/22/13 0556  WBC 14.2* 14.6* 12.6* 11.5*  NEUTROABS 11.0*  --   --   --   HGB 16.0 13.1 12.7* 12.2*  HCT 47.1 41.0 38.3* 37.4*  MCV 87.2 90.1 89.1 88.4  PLT 430* 370 317 363   Cardiac Enzymes: No results found for this basename: CKTOTAL, CKMB, CKMBINDEX, TROPONINI,  in the last 168 hours BNP: BNP (last 3 results) No results found for this basename: PROBNP,  in the last 8760 hours CBG: No results found for this basename: GLUCAP,  in the last 168 hours   Signed:  Gwenyth Bender  [facility] 07/22/2013, 9:46 AM   Attending Patient seen and examined, agree with the above assessment and plan. During my rounds this morning, patient came he was pain-free. Abdomen was very soft and nontender in exam. Upon further history taking, patient did  confess to binging on alcohol over the Thanksgiving holiday weekend. High suspicion that this is acute alcoholic pancreatitis. It seems that this is resolved with supportive care. He is stable for discharge.  Windell Norfolk MD

## 2013-07-22 NOTE — Care Management Note (Signed)
    Page 1 of 1   07/22/2013     10:33:28 AM   CARE MANAGEMENT NOTE 07/22/2013  Patient:  Jeff Watts,Jeff Watts   Account Number:  [id number]  Date Initiated:  07/22/2013  Documentation initiated by:  Rosemary Holms  Subjective/Objective Assessment:   Pt admitted from home and will dc back w/out HH/DME needs identified. Does not have a PCP     Action/Plan:   Get PCP   Anticipated DC Date:  07/22/2013   Anticipated DC Plan:  HOME/SELF CARE      DC Planning Services  CM consult      Choice offered to / List presented to:             Status of service:  Completed, signed off Medicare Important Message given?   (If response is "NO", the following Medicare IM given date fields will be blank) Date Medicare IM given:   Date Additional Medicare IM given:    Discharge Disposition:  HOME/SELF CARE  Per UR Regulation:    If discussed at Long Length of Stay Meetings, dates discussed:    Comments:  07/22/13 Rosemary Holms RN BSN CM Pt set up with [facility] on Friday 07/24/13.

## 2013-07-22 NOTE — Clinical Social Work Note (Signed)
CSW received referral today for substance abuse. Went to assess pt and pt had already d/c.   Derenda Fennel, LCSW [phone]

## 2014-03-26 ENCOUNTER — Telehealth: Payer: Self-pay | Admitting: Family Medicine

## 2014-03-26 NOTE — Telephone Encounter (Signed)
Spoke with patient mother and she is aware that he would have to be seen as a new patient in order to have ppd test

## 2015-05-15 ENCOUNTER — Encounter (HOSPITAL_COMMUNITY): Payer: Self-pay | Admitting: *Deleted

## 2015-05-15 ENCOUNTER — Observation Stay (HOSPITAL_COMMUNITY)
Admission: EM | Admit: 2015-05-15 | Discharge: 2015-05-16 | Disposition: A | Discharge status: Home / Self Care | Payer: PRIVATE HEALTH INSURANCE | Attending: Internal Medicine | Admitting: Internal Medicine

## 2015-05-15 DIAGNOSIS — J189 Pneumonia, unspecified organism: Secondary | ICD-10-CM | POA: Diagnosis present

## 2015-05-15 DIAGNOSIS — J159 Unspecified bacterial pneumonia: Secondary | ICD-10-CM | POA: Insufficient documentation

## 2015-05-15 DIAGNOSIS — Z79899 Other long term (current) drug therapy: Secondary | ICD-10-CM | POA: Insufficient documentation

## 2015-05-15 DIAGNOSIS — J45901 Unspecified asthma with (acute) exacerbation: Principal | ICD-10-CM | POA: Diagnosis present

## 2015-05-15 DIAGNOSIS — E876 Hypokalemia: Secondary | ICD-10-CM

## 2015-05-15 MED ORDER — IPRATROPIUM-ALBUTEROL 0.5-2.5 (3) MG/3ML IN SOLN
3.0000 mL | Freq: Once | RESPIRATORY_TRACT | Status: AC
  Administered 2015-05-16: 3 mL via RESPIRATORY_TRACT
  Filled 2015-05-15: qty 3

## 2015-05-15 NOTE — ED Notes (Signed)
Pt c /o wheezing, sob, chest pain that started two hours prior to arrival, pt states that he took an albuterol neb of his son, also given 5.0 mg albuterol and  solu medrol by ems while enroute with mild improvement in symptoms,

## 2015-05-15 NOTE — ED Provider Notes (Signed)
CSN: [id number]     Arrival date & time 05/15/15  2345 History  This chart was scribed for Dione Booze, MD by Ronney Lion, ED Scribe. This patient was seen in room APA12/[facility]12 and the patient's care was started at 11:57 PM.    Chief Complaint  Patient presents with  . Wheezing    The history is provided by the patient and medical records. No language interpreter was used.  HPI Comments: Jeff Watts is a 29 y.o. male who presents to the Emergency Department brought in by ambulance complaining of recurrent wheezing, SOB, and central chest tightness that began about 3 hours ago. He also notes an associated cough productive with yellow sputum, as well as diaphoresis that has since resolved. Patient had an albuterol nebulizer treatment from his son at home and was given a breathing treatment (5.0 mg albuterol and 125 mg solumedrol, per nursing notes) by ambulance, which he states somewhat improved his symptoms. Patient states he is a smoker. He does not have a PCP.   Past Medical History  Diagnosis Date  . Pancreatitis    History reviewed. No pertinent past surgical history. No family history on file. Social History  Substance Use Topics  . Smoking status: Never Smoker   . Smokeless tobacco: None  . Alcohol Use: Yes     Comment: last drink yesterday     Review of Systems  Constitutional: Positive for diaphoresis (resolved).  Respiratory: Positive for cough, chest tightness, shortness of breath and wheezing.   All other systems reviewed and are negative.   Allergies  Review of patient's allergies indicates no known allergies.  Home Medications   Prior to Admission medications   Medication Sig Start Date End Date Taking? Authorizing Provider  bismuth subsalicylate (PEPTO BISMOL) 262 MG chewable tablet Chew 524 mg by mouth as needed for indigestion or diarrhea or loose stools.    Historical Provider, MD  dicyclomine (BENTYL) 10 MG capsule Take 10 mg by mouth 4 (four) times daily as  needed for spasms.    Historical Provider, MD  loperamide (IMODIUM A-D) 2 MG tablet Take 2 mg by mouth 4 (four) times daily as needed for diarrhea or loose stools.    Historical Provider, MD  promethazine (PHENERGAN) 25 MG tablet Take 25 mg by mouth every 6 (six) hours as needed for nausea or vomiting.    Historical Provider, MD   BP 154/109 mmHg  Pulse 112  Temp(Src) 98.5 F (36.9 C) (Oral)  Resp 24  Ht  (1.6 m)  Wt 150 lb (68.04 kg)  BMI 26.58 kg/m2  SpO2 100% Physical Exam  Constitutional: He is oriented to person, place, and time. He appears well-developed and well-nourished. No distress.  Appears uncomfortable  HENT:  Head: Normocephalic and atraumatic.  Eyes: Conjunctivae and EOM are normal. Pupils are equal, round, and reactive to light.  Neck: Normal range of motion. Neck supple. No JVD present.  Cardiovascular: Normal rate, regular rhythm and normal heart sounds.   No murmur heard. Pulmonary/Chest: Effort normal. No respiratory distress. He has wheezes. He has no rales. He exhibits no tenderness.  Decreased air flow. Diffuse expiratory wheezes.  Abdominal: Soft. Bowel sounds are normal. He exhibits no mass. There is no tenderness. There is no rebound and no guarding.  Musculoskeletal: Normal range of motion. He exhibits no edema.  Lymphadenopathy:    He has no cervical adenopathy.  Neurological: He is alert and oriented to person, place, and time. No cranial nerve deficit. He exhibits  normal muscle tone. Coordination normal.  Skin: Skin is warm and dry. No rash noted.  Psychiatric: He has a normal mood and affect. His behavior is normal. Judgment and thought content normal.  Nursing note and vitals reviewed.   ED Course  Procedures (including critical care time)  DIAGNOSTIC STUDIES: Oxygen Saturation is 100% on 2 L O2/min, normal by my interpretation.    COORDINATION OF CARE: 11:55 PM - Discussed treatment plan with pt at bedside which includes breathing  treatment and CXR. Pt verbalized understanding and agreed to plan.   Labs Review Results for orders placed or performed during the hospital encounter of 05/15/15  Basic metabolic panel  Result Value Ref Range   Sodium 139 135 - 145 mmol/L   Potassium 3.0 (L) 3.5 - 5.1 mmol/L   Chloride 105 101 - 111 mmol/L   CO2 22 22 - 32 mmol/L   Glucose, Bld 138 (H) 65 - 99 mg/dL   BUN 6 6 - 20 mg/dL   Creatinine, Ser 9.60 0.61 - 1.24 mg/dL   Calcium 8.8 (L) 8.9 - 10.3 mg/dL   GFR calc non Af Amer >60 >60 mL/min   GFR calc Af Amer >60 >60 mL/min   Anion gap 12 5 - 15  CBC with Differential  Result Value Ref Range   WBC 18.5 (H) 4.0 - 10.5 K/uL   RBC 4.27 4.22 - 5.81 MIL/uL   Hemoglobin 12.5 (L) 13.0 - 17.0 g/dL   HCT 45.4 (L) 09.8 - 11.9 %   MCV 86.9 78.0 - 100.0 fL   MCH 29.3 26.0 - 34.0 pg   MCHC 33.7 30.0 - 36.0 g/dL   RDW 14.7 82.9 - 56.2 %   Platelets 491 (H) 150 - 400 K/uL   Neutrophils Relative % 93 %   Neutro Abs 17.1 (H) 1.7 - 7.7 K/uL   Lymphocytes Relative 5 %   Lymphs Abs 1.0 0.7 - 4.0 K/uL   Monocytes Relative 2 %   Monocytes Absolute 0.4 0.1 - 1.0 K/uL   Eosinophils Relative 0 %   Eosinophils Absolute 0.0 0.0 - 0.7 K/uL   Basophils Relative 0 %   Basophils Absolute 0.0 0.0 - 0.1 K/uL    Imaging Review Dg Chest 2 View  05/16/2015   CLINICAL DATA:  Cough, wheezing, shortness of breath and central chest tightness. Symptoms for 3 hours.  EXAM: CHEST  2 VIEW  COMPARISON:  07/19/2013  FINDINGS: Ill-defined increased density in the right middle lobe, concerning for pneumonia. Left lung is clear. Cardiomediastinal contours are normal. No pulmonary edema, pleural effusion, or pneumothorax. No osseous abnormalities are seen.  IMPRESSION: Ill-defined density in the right middle lobe, suspicious for pneumonia.   Electronically Signed   By: Rubye Oaks M.D.   On: 05/16/2015 00:46   I have personally reviewed and evaluated these images and lab results as part of my medical  decision-making.   EKG Interpretation   Date/Time:  Sunday May 15 2015 23:46:57 EDT Ventricular Rate:  102 PR Interval:  168 QRS Duration: 78 QT Interval:  332 QTC Calculation: 432 R Axis:   82 Text Interpretation:  Sinus tachycardia Biatrial enlargement No old  tracing to compare Confirmed by [facility]  MD, DAVID ([id number]) on 05/15/2015  11:53:48 PM      MDM   Final diagnoses:  Asthma exacerbation  Hypokalemia  Community acquired pneumonia    Exacerbation of asthma. Old records are reviewed and he has no prior ED visits for asthma. There is axial  relatively little wheezing currently and he is not thickened nor is he using accessory muscles of respiration. He is already received methylprednisolone from EMS. He'll be given additional albuterol with ipratropium.   He did not show any significant improvement in spite of several additional albuterol with ipratropium nebulizer treatments. Although there is only minimal wheezing on normal breathing, wheezing was noted with forced exhalation. Chest x-ray appears to show vague Infiltrate at the right base has a starter on anabolic of ceftriaxone. Hypokalemia is noted and he is given oral and intravenous potassium as well as intravenous magnesium. Case is discussed with Dr. Sharl Ma of [facility] who agrees to admit the patient.  I personally performed the services described in this documentation, which was scribed in my presence. The recorded information has been reviewed and is accurate.      Dione Booze, MD 05/16/15 [phone]

## 2015-05-15 NOTE — ED Notes (Signed)
Pt states that he has had this happen several times since he started working at a [employer] 4 months ago,

## 2015-05-16 ENCOUNTER — Emergency Department (HOSPITAL_COMMUNITY): Payer: PRIVATE HEALTH INSURANCE

## 2015-05-16 ENCOUNTER — Encounter (HOSPITAL_COMMUNITY): Payer: Self-pay

## 2015-05-16 DIAGNOSIS — J189 Pneumonia, unspecified organism: Secondary | ICD-10-CM | POA: Diagnosis not present

## 2015-05-16 DIAGNOSIS — E876 Hypokalemia: Secondary | ICD-10-CM

## 2015-05-16 DIAGNOSIS — J45901 Unspecified asthma with (acute) exacerbation: Secondary | ICD-10-CM

## 2015-05-16 LAB — COMPREHENSIVE METABOLIC PANEL
ALBUMIN: 3.8 g/dL (ref 3.5–5.0)
ALT: 26 U/L (ref 17–63)
ANION GAP: 12 (ref 5–15)
AST: 36 U/L (ref 15–41)
Alkaline Phosphatase: 111 U/L (ref 38–126)
BILIRUBIN TOTAL: 0.4 mg/dL (ref 0.3–1.2)
BUN: 7 mg/dL (ref 6–20)
CHLORIDE: 104 mmol/L (ref 101–111)
CO2: 23 mmol/L (ref 22–32)
Calcium: 9.2 mg/dL (ref 8.9–10.3)
Creatinine, Ser: 0.87 mg/dL (ref 0.61–1.24)
GFR calc Af Amer: >60 mL/min (ref >60)
Glucose, Bld: 147 mg/dL — ABNORMAL HIGH (ref 65–99)
POTASSIUM: 3.8 mmol/L (ref 3.5–5.1)
Sodium: 139 mmol/L (ref 135–145)
Total Protein: 7.4 g/dL (ref 6.5–8.1)

## 2015-05-16 LAB — CBC WITH DIFFERENTIAL/PLATELET
Basophils Absolute: 0 10*3/uL (ref 0.0–0.1)
Basophils Relative: 0 %
EOS ABS: 0 10*3/uL (ref 0.0–0.7)
EOS PCT: 0 %
HCT: 37.1 % — ABNORMAL LOW (ref 39.0–52.0)
Hemoglobin: 12.5 g/dL — ABNORMAL LOW (ref 13.0–17.0)
LYMPHS ABS: 1 10*3/uL (ref 0.7–4.0)
LYMPHS PCT: 5 %
MCH: 29.3 pg (ref 26.0–34.0)
MCHC: 33.7 g/dL (ref 30.0–36.0)
MCV: 86.9 fL (ref 78.0–100.0)
MONOS PCT: 2 %
Monocytes Absolute: 0.4 10*3/uL (ref 0.1–1.0)
Neutro Abs: 17.1 10*3/uL — ABNORMAL HIGH (ref 1.7–7.7)
Neutrophils Relative %: 93 %
PLATELETS: 491 10*3/uL — AB (ref 150–400)
RBC: 4.27 MIL/uL (ref 4.22–5.81)
RDW: 13.1 % (ref 11.5–15.5)
WBC: 18.5 10*3/uL — ABNORMAL HIGH (ref 4.0–10.5)

## 2015-05-16 LAB — CBC
HEMATOCRIT: 39.5 % (ref 39.0–52.0)
Hemoglobin: 12.9 g/dL — ABNORMAL LOW (ref 13.0–17.0)
MCH: 28.9 pg (ref 26.0–34.0)
MCHC: 32.7 g/dL (ref 30.0–36.0)
MCV: 88.6 fL (ref 78.0–100.0)
PLATELETS: 525 10*3/uL — AB (ref 150–400)
RBC: 4.46 MIL/uL (ref 4.22–5.81)
RDW: 13.2 % (ref 11.5–15.5)
WBC: 16.8 10*3/uL — AB (ref 4.0–10.5)

## 2015-05-16 LAB — BASIC METABOLIC PANEL
ANION GAP: 12 (ref 5–15)
BUN: 6 mg/dL (ref 6–20)
CO2: 22 mmol/L (ref 22–32)
Calcium: 8.8 mg/dL — ABNORMAL LOW (ref 8.9–10.3)
Chloride: 105 mmol/L (ref 101–111)
Creatinine, Ser: 0.82 mg/dL (ref 0.61–1.24)
GFR calc Af Amer: >60 mL/min (ref >60)
GLUCOSE: 138 mg/dL — AB (ref 65–99)
POTASSIUM: 3 mmol/L — AB (ref 3.5–5.1)
Sodium: 139 mmol/L (ref 135–145)

## 2015-05-16 MED ORDER — IPRATROPIUM-ALBUTEROL 0.5-2.5 (3) MG/3ML IN SOLN
3.0000 mL | Freq: Four times a day (QID) | RESPIRATORY_TRACT | Status: DC
Start: 2015-05-16 — End: 2015-05-16

## 2015-05-16 MED ORDER — PNEUMOCOCCAL VAC POLYVALENT 25 MCG/0.5ML IJ INJ
0.5000 mL | INJECTION | INTRAMUSCULAR | Status: DC
  Filled 2015-05-16 (×2): qty 0.5

## 2015-05-16 MED ORDER — IPRATROPIUM-ALBUTEROL 0.5-2.5 (3) MG/3ML IN SOLN
3.0000 mL | Freq: Once | RESPIRATORY_TRACT | Status: AC
  Administered 2015-05-16: 3 mL via RESPIRATORY_TRACT
  Filled 2015-05-16: qty 3

## 2015-05-16 MED ORDER — POTASSIUM CHLORIDE 10 MEQ/100ML IV SOLN
10.0000 meq | Freq: Once | INTRAVENOUS | Status: AC
  Administered 2015-05-16: 10 meq via INTRAVENOUS
  Filled 2015-05-16: qty 100

## 2015-05-16 MED ORDER — MAGNESIUM SULFATE 2 GM/50ML IV SOLN
2.0000 g | Freq: Once | INTRAVENOUS | Status: AC
  Administered 2015-05-16: 2 g via INTRAVENOUS
  Filled 2015-05-16: qty 50

## 2015-05-16 MED ORDER — ALBUTEROL SULFATE (2.5 MG/3ML) 0.083% IN NEBU: 2.5000 mg | INHALATION_SOLUTION | RESPIRATORY_TRACT | Status: DC | PRN

## 2015-05-16 MED ORDER — SODIUM CHLORIDE 0.9 % IV SOLN
INTRAVENOUS | Status: AC
  Administered 2015-05-16: 04:00:00 via INTRAVENOUS

## 2015-05-16 MED ORDER — ENOXAPARIN SODIUM 40 MG/0.4ML ~~LOC~~ SOLN
40.0000 mg | SUBCUTANEOUS | Status: DC
  Filled 2015-05-16: qty 0.4

## 2015-05-16 MED ORDER — ENOXAPARIN SODIUM 40 MG/0.4ML ~~LOC~~ SOLN: 40.0000 mg | SUBCUTANEOUS | Status: DC

## 2015-05-16 MED ORDER — ALBUTEROL SULFATE HFA 108 (90 BASE) MCG/ACT IN AERS: 2.0000 | INHALATION_SPRAY | Freq: Four times a day (QID) | RESPIRATORY_TRACT | Status: DC | PRN

## 2015-05-16 MED ORDER — ALBUTEROL SULFATE (2.5 MG/3ML) 0.083% IN NEBU: 2.5000 mg | INHALATION_SOLUTION | Freq: Four times a day (QID) | RESPIRATORY_TRACT | Status: DC

## 2015-05-16 MED ORDER — IPRATROPIUM-ALBUTEROL 0.5-2.5 (3) MG/3ML IN SOLN
3.0000 mL | Freq: Four times a day (QID) | RESPIRATORY_TRACT | Status: DC
  Administered 2015-05-16: 3 mL via RESPIRATORY_TRACT
  Filled 2015-05-16: qty 3

## 2015-05-16 MED ORDER — LEVOFLOXACIN 750 MG PO TABS: 750.0000 mg | ORAL_TABLET | Freq: Every day | ORAL | Status: DC

## 2015-05-16 MED ORDER — DEXTROSE 5 % IV SOLN
1.0000 g | Freq: Once | INTRAVENOUS | Status: AC
  Administered 2015-05-16: 1 g via INTRAVENOUS
  Filled 2015-05-16: qty 10

## 2015-05-16 MED ORDER — POTASSIUM CHLORIDE CRYS ER 20 MEQ PO TBCR
40.0000 meq | EXTENDED_RELEASE_TABLET | Freq: Once | ORAL | Status: AC
  Administered 2015-05-16: 40 meq via ORAL
  Filled 2015-05-16: qty 2

## 2015-05-16 MED ORDER — METHYLPREDNISOLONE SODIUM SUCC 125 MG IJ SOLR
60.0000 mg | Freq: Four times a day (QID) | INTRAMUSCULAR | Status: DC
  Administered 2015-05-16 (×2): 60 mg via INTRAVENOUS
  Filled 2015-05-16 (×2): qty 2

## 2015-05-16 MED ORDER — IPRATROPIUM BROMIDE 0.02 % IN SOLN: 0.5000 mg | Freq: Four times a day (QID) | RESPIRATORY_TRACT | Status: DC

## 2015-05-16 MED ORDER — LEVOFLOXACIN 750 MG PO TABS
750.0000 mg | ORAL_TABLET | Freq: Every day | ORAL | Status: DC
  Administered 2015-05-16: 750 mg via ORAL
  Filled 2015-05-16: qty 1

## 2015-05-16 MED ORDER — PNEUMOCOCCAL VAC POLYVALENT 25 MCG/0.5ML IJ INJ
0.5000 mL | INJECTION | INTRAMUSCULAR | Status: DC
  Filled 2015-05-16: qty 0.5

## 2015-05-16 MED ORDER — INFLUENZA VAC SPLIT QUAD 0.5 ML IM SUSY
0.5000 mL | PREFILLED_SYRINGE | INTRAMUSCULAR | Status: AC
  Administered 2015-05-16: 0.5 mL via INTRAMUSCULAR
  Filled 2015-05-16: qty 0.5

## 2015-05-16 NOTE — Discharge Summary (Signed)
Physician Discharge Summary  Hien Perreira ZOX:[id number] DOB: November 21, 1985 DOA: 05/15/2015  PCP: No PCP Per Patient  Admit date: 05/15/2015 Discharge date: 05/16/2015  Time spent: 35 minutes  Recommendations for Outpatient Follow-up:  1. Follow up with PCP in 1-2 weeks. 2. Continue Levaquin as prescribed.  Discharge Diagnoses:  Active Problems:   Asthma exacerbation   Hypokalemia   Discharge Condition: Improved.  Diet recommendation: Regular  Filed Weights   05/15/15 2346  Weight: 68.04 kg (150 lb)    History of present illness:  29 year old male with no relevant PMH presented with SOB and a productive cough.  CXR revealed an ill-defined density in the right middle lobe, suspicious for pneumonia. Labs revealed hypokalemia but were otherwise unremarkable. He was admitted for further management.   Hospital Course:  Possible asthma exacerbation. Patient presented with SOB, wheezing and cough. He received steroids and bronchodilators with improvement of his symptoms. He does not report a history of asthma but reports that he does smoke. He will be discharged with an albuterol inhaler as well as antibiotics.  CAP. Patient found to have possible infiltrate on CXR. He does have an elevated WBC count and productive cough. Will be discharged on a course of Levaquin. Overall he appears as though his breahihng has improved and he is no longer hypoxic. He has been advised to return to the ED if he has worsening SOB or high fevers.    1. Hypokalemia. Repleted.    Procedures:  none  Consultations:  none  Discharge Exam: Filed Vitals:   05/16/15 0325  BP: 139/86  Pulse: 83  Temp: 99.3 F (37.4 C)  Resp: 20     General: NAD, looks comfortable  Cardiovascular: RRR, S1, S2   Respiratory: clear bilaterally, No wheezing, rales or rhonchi  Abdomen: soft, non tender, no distention , bowel sounds normal  Musculoskeletal: No edema b/l  Discharge Instructions    Current  Discharge Medication List    CONTINUE these medications which have NOT CHANGED   Details  bismuth subsalicylate (PEPTO BISMOL) 262 MG chewable tablet Chew 524 mg by mouth as needed for indigestion or diarrhea or loose stools.    dicyclomine (BENTYL) 10 MG capsule Take 10 mg by mouth 4 (four) times daily as needed for spasms.    loperamide (IMODIUM A-D) 2 MG tablet Take 2 mg by mouth 4 (four) times daily as needed for diarrhea or loose stools.    promethazine (PHENERGAN) 25 MG tablet Take 25 mg by mouth every 6 (six) hours as needed for nausea or vomiting.       No Known Allergies    The results of significant diagnostics from this hospitalization (including imaging, microbiology, ancillary and laboratory) are listed below for reference.    Significant Diagnostic Studies: Dg Chest 2 View  05/16/2015   CLINICAL DATA:  Cough, wheezing, shortness of breath and central chest tightness. Symptoms for 3 hours.  EXAM: CHEST  2 VIEW  COMPARISON:  07/19/2013  FINDINGS: Ill-defined increased density in the right middle lobe, concerning for pneumonia. Left lung is clear. Cardiomediastinal contours are normal. No pulmonary edema, pleural effusion, or pneumothorax. No osseous abnormalities are seen.  IMPRESSION: Ill-defined density in the right middle lobe, suspicious for pneumonia.   Electronically Signed   By: Rubye Oaks M.D.   On: 05/16/2015 00:46      Labs: Basic Metabolic Panel:  Recent Labs Lab 05/16/15 0145 05/16/15 0530  NA 139 139  K 3.0* 3.8  CL 105 104  CO2  22 23  GLUCOSE 138* 147*  BUN 6 7  CREATININE 0.82 0.87  CALCIUM 8.8* 9.2   Liver Function Tests:  Recent Labs Lab 05/16/15 0530  AST 36  ALT 26  ALKPHOS 111  BILITOT 0.4  PROT 7.4  ALBUMIN 3.8    CBC:  Recent Labs Lab 05/16/15 0145 05/16/15 0530  WBC 18.5* 16.8*  NEUTROABS 17.1*  --   HGB 12.5* 12.9*  HCT 37.1* 39.5  MCV 86.9 88.6  PLT 491* 525*        Signed:  Jehanzeb Memon. MD  [facility] 05/16/2015, 9:25 AM  By signing my name below, I, Burnett Harry, attest that this documentation has been prepared under the direction and in the presence of [facility]. MD Electronically Signed: Burnett Harry, Scribe. 05/16/2015 9:24am   I, Dr. Erick Blinks, personally performed the services described in this documentaiton. All medical record entries made by the scribe were at my direction and in my presence. I have reviewed the chart and agree that the record reflects my personal performance and is accurate and complete  Erick Blinks, MD, 05/16/2015 9:36 AM

## 2015-05-16 NOTE — H&P (Signed)
PCP:   No PCP Per Patient   Chief Complaint:  Shortness of breath  HPI:  29 year old male who  has a past medical history of Pancreatitis. today comes to the hospital with complaints of shortness of breath which started 8:30 PM last night. Patient has a history of asthma, and complained of a productive of yellow sputum. Patient has albuterol mobilizer at home and was given breathing treatment along with salmeterol in the ambulance which improved the symptoms. Denies chest pain, no nausea vomiting or diarrhea. No fever no dysuria urgency frequency of urination.   Allergies:  No Known Allergies    Past Medical History  Diagnosis Date  . Pancreatitis       Prior to Admission medications   Medication Sig Start Date End Date Taking? Authorizing Provider  bismuth subsalicylate (PEPTO BISMOL) 262 MG chewable tablet Chew 524 mg by mouth as needed for indigestion or diarrhea or loose stools.    Historical Provider, MD  dicyclomine (BENTYL) 10 MG capsule Take 10 mg by mouth 4 (four) times daily as needed for spasms.    Historical Provider, MD  loperamide (IMODIUM A-D) 2 MG tablet Take 2 mg by mouth 4 (four) times daily as needed for diarrhea or loose stools.    Historical Provider, MD  promethazine (PHENERGAN) 25 MG tablet Take 25 mg by mouth every 6 (six) hours as needed for nausea or vomiting.    Historical Provider, MD    Social History:  reports that he has never smoked. He does not have any smokeless tobacco history on file. He reports that he drinks alcohol. He reports that he does not use illicit drugs.   Family history No family history of asthma of cancer  Filed Weights   05/15/15 2346  Weight: 68.04 kg (150 lb)    All the positives are listed in BOLD  Review of Systems:  HEENT: Headache, blurred vision, runny nose, sore throat Neck: Hypothyroidism, hyperthyroidism,,lymphadenopathy Chest : Shortness of breath, history of COPD, Asthma Heart : Chest pain, history of  coronary arterey disease GI:  Nausea, vomiting, diarrhea, constipation, GERD GU: Dysuria, urgency, frequency of urination, hematuria Neuro: Stroke, seizures, syncope Psych: Depression, anxiety, hallucinations   Physical Exam: Blood pressure 144/108, pulse 106, temperature 98.5 F (36.9 C), temperature source Oral, resp. rate 20, height  (1.6 m), weight 68.04 kg (150 lb), SpO2 100 %. Constitutional:   Patient is a well-developed and well-nourished male* in no acute distress and cooperative with exam. Head: Normocephalic and atraumatic Mouth: Mucus membranes moist Eyes: PERRL, EOMI, conjunctivae normal Neck: Supple, No Thyromegaly Cardiovascular: RRR, S1 normal, S2 normal Pulmonary/Chest: Scattered wheezing bilaterally Abdominal: Soft. Non-tender, non-distended, bowel sounds are normal, no masses, organomegaly, or guarding present.  Neurological: A&O x3, Strength is normal and symmetric bilaterally, cranial nerve II-XII are grossly intact, no focal motor deficit, sensory intact to light touch bilaterally.  Extremities : No Cyanosis, Clubbing or Edema  Labs on Admission:  Basic Metabolic Panel:  Recent Labs Lab 05/16/15 0145  NA 139  K 3.0*  CL 105  CO2 22  GLUCOSE 138*  BUN 6  CREATININE 0.82  CALCIUM 8.8*   Liver Function Tests: No results for input(s): AST, ALT, ALKPHOS, BILITOT, PROT, ALBUMIN in the last 168 hours. No results for input(s): LIPASE, AMYLASE in the last 168 hours. No results for input(s): AMMONIA in the last 168 hours. CBC:  Recent Labs Lab 05/16/15 0145  WBC 18.5*  NEUTROABS 17.1*  HGB 12.5*  HCT 37.1*  MCV 86.9  PLT 491*    Radiological Exams on Admission: Dg Chest 2 View  05/16/2015   CLINICAL DATA:  Cough, wheezing, shortness of breath and central chest tightness. Symptoms for 3 hours.  EXAM: CHEST  2 VIEW  COMPARISON:  07/19/2013  FINDINGS: Ill-defined increased density in the right middle lobe, concerning for pneumonia. Left lung is  clear. Cardiomediastinal contours are normal. No pulmonary edema, pleural effusion, or pneumothorax. No osseous abnormalities are seen.  IMPRESSION: Ill-defined density in the right middle lobe, suspicious for pneumonia.   Electronically Signed   By: Rubye Oaks M.D.   On: 05/16/2015 00:46    EKG: Independently reviewed. Sinus tachycardia   Assessment/Plan Active Problems:   Asthma exacerbation   Hypokalemia  Asthma exacerbation Will admit under observation. Start Solu-Medrol 60 kg IV every 6 hours.  DuoNeb nebulizers every 6 hours, albuterol every 2 hours when necessary If improves patient can be discharge later today.  Hypokalemia Replace potassium and check BMP in a.m.  DVT prophylaxis Lovenox  Code status: Full code  Family discussion: No family at bedside   Time Spent on Admission: 50 minutes  LAMA,GAGAN S [facility] Pager: [phone] 05/16/2015, 3:04 AM  If 7PM-7AM, please contact night-coverage  [email or web address]  Password [email or web address]

## 2015-05-16 NOTE — Care Management Note (Signed)
Case Management Note  Patient Details  Name: Jeff Watts MRN: [id number] Date of Birth: 06/25/1986  Expected Discharge Date:      05/16/2015            Expected Discharge Plan:  Home/Self Care  In-House Referral:  NA  Discharge planning Services  CM Consult  Post Acute Care Choice:  NA Choice offered to:  NA  DME Arranged:    DME Agency:     HH Arranged:    HH Agency:     Status of Service:  Completed, signed off  Medicare Important Message Given:    Date Medicare IM Given:    Medicare IM give by:    Date Additional Medicare IM Given:    Additional Medicare Important Message give by:     If discussed at Long Length of Stay Meetings, dates discussed:    Additional Comments: Pt is from home and ind with ADL's. Pt discharging home today with PCP f/u in 2 weeks. Pt says he does not have a PCP but knows of a clinic near his home he wants to go, he will make his own appointment. No CM needs identified.  Malcolm Metro, RN 05/16/2015, 10:26 AM

## 2015-05-17 NOTE — Progress Notes (Signed)
Discharge instructions, prescriptions and work note given, verbalized understanding, out in stable condition ambulatory. 

## 2015-05-20 ENCOUNTER — Emergency Department (HOSPITAL_COMMUNITY): Payer: PRIVATE HEALTH INSURANCE

## 2015-05-20 ENCOUNTER — Encounter (HOSPITAL_COMMUNITY): Payer: Self-pay | Admitting: Emergency Medicine

## 2015-05-20 ENCOUNTER — Emergency Department (HOSPITAL_COMMUNITY)
Admission: EM | Admit: 2015-05-20 | Discharge: 2015-05-20 | Disposition: A | Discharge status: Home / Self Care | Payer: PRIVATE HEALTH INSURANCE | Attending: Emergency Medicine | Admitting: Emergency Medicine

## 2015-05-20 DIAGNOSIS — Z8719 Personal history of other diseases of the digestive system: Secondary | ICD-10-CM | POA: Insufficient documentation

## 2015-05-20 DIAGNOSIS — Z792 Long term (current) use of antibiotics: Secondary | ICD-10-CM | POA: Insufficient documentation

## 2015-05-20 DIAGNOSIS — Z720 Tobacco use: Secondary | ICD-10-CM | POA: Insufficient documentation

## 2015-05-20 DIAGNOSIS — R062 Wheezing: Secondary | ICD-10-CM | POA: Insufficient documentation

## 2015-05-20 DIAGNOSIS — R05 Cough: Secondary | ICD-10-CM | POA: Insufficient documentation

## 2015-05-20 DIAGNOSIS — R059 Cough, unspecified: Secondary | ICD-10-CM

## 2015-05-20 DIAGNOSIS — Z79899 Other long term (current) drug therapy: Secondary | ICD-10-CM | POA: Insufficient documentation

## 2015-05-20 MED ORDER — AZITHROMYCIN 250 MG PO TABS: 250.0000 mg | ORAL_TABLET | Freq: Every day | ORAL | Status: DC

## 2015-05-20 MED ORDER — ALBUTEROL SULFATE (2.5 MG/3ML) 0.083% IN NEBU
5.0000 mg | INHALATION_SOLUTION | Freq: Once | RESPIRATORY_TRACT | Status: AC
  Administered 2015-05-20: 5 mg via RESPIRATORY_TRACT
  Filled 2015-05-20: qty 6

## 2015-05-20 MED ORDER — ALBUTEROL SULFATE HFA 108 (90 BASE) MCG/ACT IN AERS
2.0000 | INHALATION_SPRAY | Freq: Four times a day (QID) | RESPIRATORY_TRACT | Status: DC
  Administered 2015-05-20: 2 via RESPIRATORY_TRACT
  Filled 2015-05-20: qty 6.7

## 2015-05-20 NOTE — ED Notes (Signed)
Pt states he was seen here and admitted on Sunday for pneumonia. Pt states this afternoon he woke coughing and now feels SOB.

## 2015-05-20 NOTE — ED Provider Notes (Signed)
CSN: [id number]     Arrival date & time 05/20/15  1706 History   First MD Initiated Contact with Patient 05/20/15 1717     Chief Complaint  Patient presents with  . Pneumonia     (Consider location/radiation/quality/duration/timing/severity/associated sxs/prior Treatment) HPI Patient presents 3 days after discharge following an admission for pneumonia, now with concern for increased cough, dyspnea. Patient was admitted, was hospitalized for one day for pneumonia. Patient notes that since discharge she continues to smoke, though he is trying to quit. He has not obtained either the broncho-dilator, with antibiotics since discharge. He denies any new fever, chills, vomiting, diarrhea. Today, in the hours prior to my evaluation he felt more dyspneic, with more increased, prominent cough. No relief with anything.   Smoking cessation provided, particularly in light of this patient's evaluation in the ED.  Past Medical History  Diagnosis Date  . Pancreatitis   . Pancreatitis    History reviewed. No pertinent past surgical history. Family History  Problem Relation Age of Onset  . Hypertension Father    Social History  Substance Use Topics  . Smoking status: Current Every Day Smoker -- 0.50 packs/day  . Smokeless tobacco: None  . Alcohol Use: Yes     Comment: last drink yesterday     Review of Systems  Constitutional:       Per HPI, otherwise negative  HENT:       Per HPI, otherwise negative  Respiratory:       Per HPI, otherwise negative  Cardiovascular:       Per HPI, otherwise negative  Gastrointestinal: Negative for vomiting.  Endocrine:       Negative aside from HPI  Genitourinary:       Neg aside from HPI   Musculoskeletal:       Per HPI, otherwise negative  Skin: Negative.   Neurological: Negative for syncope.      Allergies  Review of patient's allergies indicates no known allergies.  Home Medications   Prior to Admission medications   Medication Sig  Start Date End Date Taking? Authorizing Provider  albuterol (PROVENTIL HFA;VENTOLIN HFA) 108 (90 BASE) MCG/ACT inhaler Inhale 2 puffs into the lungs every 6 (six) hours as needed for wheezing or shortness of breath. 05/16/15   Erick Blinks, MD  bismuth subsalicylate (PEPTO BISMOL) 262 MG chewable tablet Chew 524 mg by mouth as needed for indigestion or diarrhea or loose stools.    Historical Provider, MD  levofloxacin (LEVAQUIN) 750 MG tablet Take 1 tablet (750 mg total) by mouth daily. 05/16/15   Erick Blinks, MD   BP 169/105 mmHg  Pulse 104  Temp(Src) 98.5 F (36.9 C)  Resp 18  Ht  (1.626 m)  Wt 150 lb (68.04 kg)  BMI 25.73 kg/m2  SpO2 96% Physical Exam  Constitutional: He is oriented to person, place, and time. He appears well-developed. No distress.  HENT:  Head: Normocephalic and atraumatic.  Eyes: Conjunctivae and EOM are normal.  Cardiovascular: Normal rate and regular rhythm.   Pulmonary/Chest: No stridor. No respiratory distress. He has decreased breath sounds. He has wheezes.  Abdominal: He exhibits no distension.  Musculoskeletal: He exhibits no edema.  Neurological: He is alert and oriented to person, place, and time.  Skin: Skin is warm and dry.  Psychiatric: He has a normal mood and affect.  Nursing note and vitals reviewed.   ED Course  Procedures (including critical care time)    Imaging Review Dg Chest 2 View  05/20/2015   CLINICAL DATA:  Worsening shortness of breath today. Patient was diagnosed with pneumonia 1 week ago.  EXAM: CHEST  2 VIEW  COMPARISON:  May 16, 2015  FINDINGS: The heart size and mediastinal contours are within normal limits. The previously noted ill-defined density in the medial right lung base is not seen today. There is no focal infiltrate, pulmonary edema, or pleural effusion. The visualized skeletal structures are unremarkable.  IMPRESSION: No active cardiopulmonary disease. Previously noted ill-defined density in the medial  right lung base is not seen today.   Electronically Signed   By: Sherian Rein M.D.   On: 05/20/2015 18:45   I have personally reviewed and evaluated these images and lab results as part of my medical decision-making.  Pulse oximetry 100% room air normal Chart review demonstrates recent initiation of ceftriaxone for possible early pneumonia.  On repeat exam the patient is in no distress, no new complaints. He appears well. We discussed all findings appear  MDM  Patient presents several days after recent admission for asthma exacerbation, likely contacted by pneumonia, now with persistent cough. Here the patient is afebrile, in no distress, though mildly tachycardic. No evidence for worsening pneumonia on x-ray, and features actually suggest improvement. Patient has not obtained his antibiotics, and will be provided antibiotics, which either here, will follow up with primary care. No evidence for pneumothorax, pulmonary embolism, ACS.   Gerhard Munch, MD 05/20/15 [phone]

## 2015-05-20 NOTE — Discharge Instructions (Signed)
As discussed, your evaluation today has been largely reassuring.  But, it is important that you monitor your condition carefully, and do not hesitate to return to the ED if you develop new, or concerning changes in your condition.  For the next 2 days, please be sure to use your albuterol every 4 hours.  You may then use it as needed.  Otherwise, please follow-up with your physician for appropriate ongoing care.

## 2015-07-09 ENCOUNTER — Emergency Department (HOSPITAL_COMMUNITY)
Admission: EM | Admit: 2015-07-09 | Discharge: 2015-07-09 | Disposition: A | Discharge status: Home / Self Care | Payer: BLUE CROSS/BLUE SHIELD | Attending: Emergency Medicine | Admitting: Emergency Medicine

## 2015-07-09 ENCOUNTER — Encounter (HOSPITAL_COMMUNITY): Payer: Self-pay | Admitting: *Deleted

## 2015-07-09 DIAGNOSIS — R1013 Epigastric pain: Secondary | ICD-10-CM | POA: Diagnosis present

## 2015-07-09 DIAGNOSIS — K859 Acute pancreatitis without necrosis or infection, unspecified: Secondary | ICD-10-CM | POA: Diagnosis not present

## 2015-07-09 DIAGNOSIS — R0682 Tachypnea, not elsewhere classified: Secondary | ICD-10-CM | POA: Diagnosis not present

## 2015-07-09 DIAGNOSIS — R000 Tachycardia, unspecified: Secondary | ICD-10-CM | POA: Insufficient documentation

## 2015-07-09 DIAGNOSIS — F17200 Nicotine dependence, unspecified, uncomplicated: Secondary | ICD-10-CM | POA: Insufficient documentation

## 2015-07-09 LAB — COMPREHENSIVE METABOLIC PANEL
ALT: 22 U/L (ref 17–63)
AST: 42 U/L — AB (ref 15–41)
Albumin: 4.4 g/dL (ref 3.5–5.0)
Alkaline Phosphatase: 126 U/L (ref 38–126)
Anion gap: 20 — ABNORMAL HIGH (ref 5–15)
BUN: 9 mg/dL (ref 6–20)
CHLORIDE: 100 mmol/L — AB (ref 101–111)
CO2: 21 mmol/L — AB (ref 22–32)
Calcium: 9.8 mg/dL (ref 8.9–10.3)
Creatinine, Ser: 1.1 mg/dL (ref 0.61–1.24)
Glucose, Bld: 143 mg/dL — ABNORMAL HIGH (ref 65–99)
POTASSIUM: 2.9 mmol/L — AB (ref 3.5–5.1)
SODIUM: 141 mmol/L (ref 135–145)
Total Bilirubin: 0.6 mg/dL (ref 0.3–1.2)
Total Protein: 8.4 g/dL — ABNORMAL HIGH (ref 6.5–8.1)

## 2015-07-09 LAB — I-STAT CHEM 8, ED
BUN: 5 mg/dL — AB (ref 6–20)
CREATININE: 0.8 mg/dL (ref 0.61–1.24)
Calcium, Ion: 1.02 mmol/L — ABNORMAL LOW (ref 1.12–1.23)
Chloride: 104 mmol/L (ref 101–111)
GLUCOSE: 103 mg/dL — AB (ref 65–99)
HEMATOCRIT: 44 % (ref 39.0–52.0)
Hemoglobin: 15 g/dL (ref 13.0–17.0)
POTASSIUM: 3.8 mmol/L (ref 3.5–5.1)
Sodium: 141 mmol/L (ref 135–145)
TCO2: 20 mmol/L (ref 0–100)

## 2015-07-09 LAB — URINALYSIS, ROUTINE W REFLEX MICROSCOPIC
Bilirubin Urine: NEGATIVE
GLUCOSE, UA: NEGATIVE mg/dL
Hgb urine dipstick: NEGATIVE
KETONES UR: 15 mg/dL — AB
LEUKOCYTES UA: NEGATIVE
Nitrite: NEGATIVE
PH: 6 (ref 5.0–8.0)
PROTEIN: 30 mg/dL — AB

## 2015-07-09 LAB — CBC
HEMATOCRIT: 45.7 % (ref 39.0–52.0)
Hemoglobin: 15.4 g/dL (ref 13.0–17.0)
MCH: 29.3 pg (ref 26.0–34.0)
MCHC: 33.7 g/dL (ref 30.0–36.0)
MCV: 86.9 fL (ref 78.0–100.0)
Platelets: 332 10*3/uL (ref 150–400)
RBC: 5.26 MIL/uL (ref 4.22–5.81)
RDW: 13.6 % (ref 11.5–15.5)
WBC: 10.5 10*3/uL (ref 4.0–10.5)

## 2015-07-09 LAB — URINE MICROSCOPIC-ADD ON

## 2015-07-09 LAB — LIPASE, BLOOD: LIPASE: 122 U/L — AB (ref 11–51)

## 2015-07-09 MED ORDER — METOCLOPRAMIDE HCL 5 MG/ML IJ SOLN: 10.0000 mg | INTRAMUSCULAR | Status: DC | PRN

## 2015-07-09 MED ORDER — HYDROMORPHONE HCL 1 MG/ML IJ SOLN
1.0000 mg | Freq: Once | INTRAMUSCULAR | Status: AC
  Administered 2015-07-09: 1 mg via INTRAVENOUS
  Filled 2015-07-09: qty 1

## 2015-07-09 MED ORDER — ONDANSETRON HCL 4 MG/2ML IJ SOLN
4.0000 mg | Freq: Once | INTRAMUSCULAR | Status: AC | PRN
  Administered 2015-07-09: 4 mg via INTRAVENOUS
  Filled 2015-07-09: qty 2

## 2015-07-09 MED ORDER — ONDANSETRON 4 MG PO TBDP: 4.0000 mg | ORAL_TABLET | Freq: Three times a day (TID) | ORAL | Status: DC | PRN

## 2015-07-09 MED ORDER — POTASSIUM CHLORIDE CRYS ER 20 MEQ PO TBCR
40.0000 meq | EXTENDED_RELEASE_TABLET | Freq: Once | ORAL | Status: AC
  Administered 2015-07-09: 40 meq via ORAL
  Filled 2015-07-09: qty 2

## 2015-07-09 MED ORDER — OXYCODONE-ACETAMINOPHEN 5-325 MG PO TABS: 2.0000 | ORAL_TABLET | ORAL | Status: DC | PRN

## 2015-07-09 MED ORDER — SODIUM CHLORIDE 0.9 % IV BOLUS (SEPSIS)
1000.0000 mL | Freq: Once | INTRAVENOUS | Status: AC
  Administered 2015-07-09: 1000 mL via INTRAVENOUS

## 2015-07-09 MED ORDER — POTASSIUM CHLORIDE 10 MEQ/100ML IV SOLN
10.0000 meq | INTRAVENOUS | Status: AC
  Administered 2015-07-09 (×3): 10 meq via INTRAVENOUS
  Filled 2015-07-09 (×3): qty 100

## 2015-07-09 MED ORDER — OXYCODONE-ACETAMINOPHEN 5-325 MG PO TABS
2.0000 | ORAL_TABLET | Freq: Once | ORAL | Status: AC
  Administered 2015-07-09: 2 via ORAL
  Filled 2015-07-09: qty 2

## 2015-07-09 MED ORDER — ONDANSETRON 4 MG PO TBDP
4.0000 mg | ORAL_TABLET | Freq: Once | ORAL | Status: AC
  Administered 2015-07-09: 4 mg via ORAL
  Filled 2015-07-09: qty 1

## 2015-07-09 NOTE — ED Notes (Signed)
Pt states understanding of care given and follow up instructions.  Ambulated from ed 

## 2015-07-09 NOTE — ED Notes (Signed)
Pt c/o of mid, upper abdominal pain and vomiting since yesterday. Pt actively vomiting in triage.

## 2015-07-09 NOTE — ED Provider Notes (Signed)
CSN: [id number]     Arrival date & time 07/09/15  1204 History   First MD Initiated Contact with Patient 07/09/15 1229     Chief Complaint  Patient presents with  . Abdominal Pain     (Consider location/radiation/quality/duration/timing/severity/associated sxs/prior Treatment) Patient is a 29 y.o. male presenting with abdominal pain.  Abdominal Pain Pain location:  Epigastric Pain quality: aching and sharp   Pain radiates to:  Back Pain severity:  Mild Onset quality:  Gradual Duration:  1 day Chronicity:  Recurrent Context: not alcohol use, not diet changes, not previous surgeries and not recent illness   Relieved by:  None tried Worsened by:  Nothing tried Ineffective treatments:  None tried Associated symptoms: nausea and vomiting   Associated symptoms: no anorexia, no belching, no chills, no cough, no diarrhea, no dysuria, no fatigue, no fever and no shortness of breath   Risk factors: alcohol abuse     Past Medical History  Diagnosis Date  . Pancreatitis   . Pancreatitis    History reviewed. No pertinent past surgical history. Family History  Problem Relation Age of Onset  . Hypertension Father    Social History  Substance Use Topics  . Smoking status: Current Every Day Smoker -- 0.50 packs/day  . Smokeless tobacco: None  . Alcohol Use: Yes     Comment: last drink yesterday     Review of Systems  Constitutional: Negative for fever, chills and fatigue.  Eyes: Negative for photophobia and pain.  Respiratory: Negative for cough and shortness of breath.   Gastrointestinal: Positive for nausea, vomiting and abdominal pain. Negative for diarrhea, abdominal distention and anorexia.  Genitourinary: Negative for dysuria.  All other systems reviewed and are negative.     Allergies  Review of patient's allergies indicates no known allergies.  Home Medications   Prior to Admission medications   Medication Sig Start Date End Date Taking? Authorizing Provider   ondansetron (ZOFRAN-ODT) 4 MG disintegrating tablet Take 1 tablet (4 mg total) by mouth every 8 (eight) hours as needed for nausea or vomiting. 07/09/15   Marily MemosJason Aloysuis Ribaudo, MD  oxyCODONE-acetaminophen (PERCOCET/ROXICET) 5-325 MG tablet Take 2 tablets by mouth every 4 (four) hours as needed for severe pain. 07/09/15   Shawnna Pancake, MD   BP 172/104 mmHg  Pulse 71  Temp(Src) 97.4 F (36.3 C) (Oral)  Resp 20  SpO2 99% Physical Exam  Constitutional: He is oriented to person, place, and time. He appears well-developed and well-nourished.  HENT:  Head: Normocephalic and atraumatic.  Neck: Normal range of motion. Neck supple.  Cardiovascular: Tachycardia present.   Pulmonary/Chest: Effort normal. Tachypnea noted. No respiratory distress. He has no wheezes. He has no rales.  Abdominal: Soft. Bowel sounds are normal. He exhibits no distension. There is tenderness. There is no rebound and no guarding.  Musculoskeletal: Normal range of motion. He exhibits no edema or tenderness.  Neurological: He is alert and oriented to person, place, and time. No cranial nerve deficit. Coordination normal.  Nursing note and vitals reviewed.   ED Course  Procedures (including critical care time) Labs Review Labs Reviewed  LIPASE, BLOOD - Abnormal; Notable for the following:    Lipase 122 (*)    All other components within normal limits  COMPREHENSIVE METABOLIC PANEL - Abnormal; Notable for the following:    Potassium 2.9 (*)    Chloride 100 (*)    CO2 21 (*)    Glucose, Bld 143 (*)    Total Protein 8.4 (*)  AST 42 (*)    Anion gap 20 (*)    All other components within normal limits  URINALYSIS, ROUTINE W REFLEX MICROSCOPIC (NOT AT [facility]) - Abnormal; Notable for the following:    APPearance HAZY (*)    Specific Gravity, Urine >1.030 (*)    Ketones, ur 15 (*)    Protein, ur 30 (*)    All other components within normal limits  URINE MICROSCOPIC-ADD ON - Abnormal; Notable for the following:    Squamous  Epithelial / LPF 0-5 (*)    Bacteria, UA FEW (*)    All other components within normal limits  I-STAT CHEM 8, ED - Abnormal; Notable for the following:    BUN 5 (*)    Glucose, Bld 103 (*)    Calcium, Ion 1.02 (*)    All other components within normal limits  CBC    Imaging Review No results found. I have personally reviewed and evaluated these images and lab results as part of my medical decision-making.   EKG Interpretation None      MDM   Final diagnoses:  Acute pancreatitis, unspecified pancreatitis type   Epigastric abdominal pain similar to pancreatitis he had 2 years ago. Was drinking recently. Exam with ttp without peritonitis in epigastric area. Slightly tachy. No icterus. No lower abdominal pain.  Possible recurrent pancreatitis, will eval for same. Less likely hepatobiliary, possibly gastritis, will give GI cocktail. Last BM yesterday, doubt obstruction. Will tx symptomatically and check labs.  Patient with slightly elevated lipase however not meet strict criteria for pancreatitis. His pain is improved after 2 L of fluids and some pain medicine. Had hypokalemia which resolved with some runs of potassium. No vomiting while in the emergency department. Patient able tolerate by mouth and significant improved since been told she. I discussed this with him and his family and we decided on a trial of outpatient antiemetics and pain medications. If he is not improving in 2 days or if he worsens at any point and not able tolerate by mouth he will return here for further evaluation and possible admission.     Marily Memos, MD 07/09/15 2132

## 2015-07-14 ENCOUNTER — Emergency Department (HOSPITAL_COMMUNITY)
Admission: EM | Admit: 2015-07-14 | Discharge: 2015-07-14 | Disposition: A | Discharge status: Home / Self Care | Payer: BLUE CROSS/BLUE SHIELD | Attending: Emergency Medicine | Admitting: Emergency Medicine

## 2015-07-14 ENCOUNTER — Encounter (HOSPITAL_COMMUNITY): Payer: Self-pay

## 2015-07-14 DIAGNOSIS — F17200 Nicotine dependence, unspecified, uncomplicated: Secondary | ICD-10-CM | POA: Insufficient documentation

## 2015-07-14 DIAGNOSIS — R1013 Epigastric pain: Secondary | ICD-10-CM | POA: Diagnosis present

## 2015-07-14 DIAGNOSIS — R109 Unspecified abdominal pain: Secondary | ICD-10-CM

## 2015-07-14 DIAGNOSIS — R112 Nausea with vomiting, unspecified: Secondary | ICD-10-CM | POA: Diagnosis not present

## 2015-07-14 DIAGNOSIS — Z8719 Personal history of other diseases of the digestive system: Secondary | ICD-10-CM | POA: Insufficient documentation

## 2015-07-14 DIAGNOSIS — R1084 Generalized abdominal pain: Secondary | ICD-10-CM | POA: Diagnosis not present

## 2015-07-14 DIAGNOSIS — R197 Diarrhea, unspecified: Secondary | ICD-10-CM | POA: Diagnosis not present

## 2015-07-14 LAB — CBC WITH DIFFERENTIAL/PLATELET
BASOS ABS: 0 10*3/uL (ref 0.0–0.1)
BASOS PCT: 0 %
EOS ABS: 0.2 10*3/uL (ref 0.0–0.7)
Eosinophils Relative: 2 %
HEMATOCRIT: 40.9 % (ref 39.0–52.0)
HEMOGLOBIN: 13.9 g/dL (ref 13.0–17.0)
Lymphocytes Relative: 27 %
Lymphs Abs: 2 10*3/uL (ref 0.7–4.0)
MCH: 29.9 pg (ref 26.0–34.0)
MCHC: 34 g/dL (ref 30.0–36.0)
MCV: 88 fL (ref 78.0–100.0)
MONOS PCT: 4 %
Monocytes Absolute: 0.3 10*3/uL (ref 0.1–1.0)
NEUTROS ABS: 4.9 10*3/uL (ref 1.7–7.7)
NEUTROS PCT: 67 %
Platelets: 357 10*3/uL (ref 150–400)
RBC: 4.65 MIL/uL (ref 4.22–5.81)
RDW: 13.4 % (ref 11.5–15.5)
WBC: 7.4 10*3/uL (ref 4.0–10.5)

## 2015-07-14 LAB — COMPREHENSIVE METABOLIC PANEL
ALBUMIN: 4.3 g/dL (ref 3.5–5.0)
ALK PHOS: 118 U/L (ref 38–126)
ALT: 22 U/L (ref 17–63)
ANION GAP: 10 (ref 5–15)
AST: 33 U/L (ref 15–41)
BILIRUBIN TOTAL: 0.6 mg/dL (ref 0.3–1.2)
BUN: 7 mg/dL (ref 6–20)
CO2: 28 mmol/L (ref 22–32)
Calcium: 10 mg/dL (ref 8.9–10.3)
Chloride: 102 mmol/L (ref 101–111)
Creatinine, Ser: 0.8 mg/dL (ref 0.61–1.24)
GFR calc Af Amer: >60 mL/min (ref >60)
GFR calc non Af Amer: >60 mL/min (ref >60)
Glucose, Bld: 102 mg/dL — ABNORMAL HIGH (ref 65–99)
POTASSIUM: 3.6 mmol/L (ref 3.5–5.1)
SODIUM: 140 mmol/L (ref 135–145)
Total Protein: 7.9 g/dL (ref 6.5–8.1)

## 2015-07-14 LAB — LIPASE, BLOOD: LIPASE: 42 U/L (ref 11–51)

## 2015-07-14 MED ORDER — METRONIDAZOLE 500 MG PO TABS: 500.0000 mg | ORAL_TABLET | Freq: Two times a day (BID) | ORAL | Status: DC

## 2015-07-14 MED ORDER — SODIUM CHLORIDE 0.9 % IV BOLUS (SEPSIS)
1000.0000 mL | Freq: Once | INTRAVENOUS | Status: AC
  Administered 2015-07-14: 1000 mL via INTRAVENOUS

## 2015-07-14 MED ORDER — DICYCLOMINE HCL 20 MG PO TABS: ORAL_TABLET | ORAL | Status: DC

## 2015-07-14 MED ORDER — ONDANSETRON 4 MG PO TBDP: ORAL_TABLET | ORAL | Status: DC

## 2015-07-14 NOTE — ED Provider Notes (Signed)
CSN: [id number]   Arrival date & time 07/14/15 0840  History  By signing my name below, I, Jeff Watts, attest that this documentation has been prepared under the direction and in the presence of Jeff BerkshireJoseph Jeff Lux, MD. Electronically Signed: Bethel BornBritney Watts, ED Scribe. 07/14/2015. 9:18 AM.  Chief Complaint  Patient presents with  . Abdominal Pain    HPI Patient is a 29 y.o. male presenting with abdominal pain. The history is provided by the patient. No language interpreter was used.  Abdominal Pain Pain location:  Epigastric Pain radiates to:  Does not radiate Onset quality:  Gradual Duration:  12 hours Timing:  Constant Progression:  Improving Chronicity:  Recurrent Context: not trauma   Relieved by:  Nothing Worsened by:  Nothing tried Ineffective treatments:  None tried Associated symptoms: diarrhea, nausea and vomiting   Associated symptoms: no chills and no fever    Jeff Watts is a 29 y.o. male with PMHx  of pancreatitis who presents to the Emergency Department complaining of recurrent, atraumatic,  gradually improving, 2/10 in severity upper abdominal pain with onset last night.. Last night the pain was similar to pain that he has had in the past with pancreatitis. Associated symptoms include nausea, vomiting, and diarrhea last night but none today.  Past Medical History  Diagnosis Date  . Pancreatitis   . Pancreatitis     History reviewed. No pertinent past surgical history.  Family History  Problem Relation Age of Onset  . Hypertension Father     Social History  Substance Use Topics  . Smoking status: Current Every Day Smoker -- 0.50 packs/day  . Smokeless tobacco: None  . Alcohol Use: Yes     Comment: last drink Thursday     Review of Systems  Constitutional: Negative for fever and chills.  Gastrointestinal: Positive for nausea, vomiting, abdominal pain and diarrhea.  All other systems reviewed and are negative.  Home Medications   Prior to Admission  medications   Medication Sig Start Date End Date Taking? Authorizing Provider  ondansetron (ZOFRAN-ODT) 4 MG disintegrating tablet Take 1 tablet (4 mg total) by mouth every 8 (eight) hours as needed for nausea or vomiting. 07/09/15  Yes Marily MemosJason Mesner, MD  oxyCODONE-acetaminophen (PERCOCET/ROXICET) 5-325 MG tablet Take 2 tablets by mouth every 4 (four) hours as needed for severe pain. 07/09/15  Yes Marily MemosJason Mesner, MD  metroNIDAZOLE (FLAGYL) 500 MG tablet Take 1 tablet (500 mg total) by mouth 2 (two) times daily. One po bid x 7 days 07/14/15   Jeff BerkshireJoseph Jeff Robicheaux, MD    Allergies  Review of patient's allergies indicates no known allergies.  Triage Vitals: BP 169/122 mmHg  Pulse 95  Temp(Src) 97.8 F (36.6 C) (Oral)  Resp 18  Ht 5\' 3"  (1.6 m)  Wt 150 lb (68.04 kg)  BMI 26.58 kg/m2  SpO2 100%  Physical Exam  Constitutional: He is oriented to person, place, and time. He appears well-developed.  HENT:  Head: Normocephalic.  Eyes: Conjunctivae and EOM are normal. No scleral icterus.  Neck: Neck supple. No thyromegaly present.  Cardiovascular: Normal rate and regular rhythm.  Exam reveals no gallop and no friction rub.   No murmur heard. Pulmonary/Chest: No stridor. He has no wheezes. He has no rales. He exhibits no tenderness.  Abdominal: He exhibits no distension. There is tenderness. There is no rebound.  Minimal tenderness throughout the abdomen  Musculoskeletal: Normal range of motion. He exhibits no edema.  Lymphadenopathy:    He has no cervical adenopathy.  Neurological:  He is oriented to person, place, and time. He exhibits normal muscle tone. Coordination normal.  Skin: No rash noted. No erythema.  Psychiatric: He has a normal mood and affect. His behavior is normal.    ED Course  Procedures   DIAGNOSTIC STUDIES: Oxygen Saturation is 100% on RA, normal by my interpretation.    COORDINATION OF CARE: 9:13 AM Discussed treatment plan which includes lab work and IVF with pt at  bedside and pt agreed to plan.  Labs Reviewed  COMPREHENSIVE METABOLIC PANEL - Abnormal; Notable for the following:    Glucose, Bld 102 (*)    All other components within normal limits  CBC WITH DIFFERENTIAL/PLATELET  LIPASE, BLOOD    Imaging Review No results found.   MDM     Patient with abdominal pain that has resolved. Patient history of pancreatitis. Possible problems with his pancreas. Patient given Zofran and Bentyl and will follow-up as needed. Patient also follow-up for his elevated blood pressure  The chart was scribed for me under my direct supervision.  I personally performed the history, physical, and medical decision making and all procedures in the evaluation of this patient.Jeff Berkshire, MD 07/14/15 [phone]

## 2015-07-14 NOTE — Discharge Instructions (Signed)
Follow up with your md as needed °

## 2015-07-14 NOTE — ED Notes (Signed)
Pt reports was treated for pancreatitis Friday.  Pt says last night started having upper abd pain, n/v/d again.

## 2015-07-23 ENCOUNTER — Emergency Department (HOSPITAL_COMMUNITY)
Admission: EM | Admit: 2015-07-23 | Discharge: 2015-07-23 | Disposition: A | Discharge status: Home / Self Care | Payer: BLUE CROSS/BLUE SHIELD | Attending: Emergency Medicine | Admitting: Emergency Medicine

## 2015-07-23 ENCOUNTER — Encounter (HOSPITAL_COMMUNITY): Payer: Self-pay | Admitting: Emergency Medicine

## 2015-07-23 DIAGNOSIS — Z79899 Other long term (current) drug therapy: Secondary | ICD-10-CM | POA: Diagnosis not present

## 2015-07-23 DIAGNOSIS — Z8719 Personal history of other diseases of the digestive system: Secondary | ICD-10-CM | POA: Diagnosis not present

## 2015-07-23 DIAGNOSIS — F17210 Nicotine dependence, cigarettes, uncomplicated: Secondary | ICD-10-CM | POA: Diagnosis not present

## 2015-07-23 DIAGNOSIS — R1013 Epigastric pain: Secondary | ICD-10-CM | POA: Insufficient documentation

## 2015-07-23 LAB — CBC WITH DIFFERENTIAL/PLATELET
Basophils Absolute: 0 10*3/uL (ref 0.0–0.1)
Basophils Relative: 0 %
Eosinophils Absolute: 0 10*3/uL (ref 0.0–0.7)
Eosinophils Relative: 0 %
HCT: 40.6 % (ref 39.0–52.0)
Hemoglobin: 13.9 g/dL (ref 13.0–17.0)
Lymphocytes Relative: 26 %
Lymphs Abs: 2 10*3/uL (ref 0.7–4.0)
MCH: 29.8 pg (ref 26.0–34.0)
MCHC: 34.2 g/dL (ref 30.0–36.0)
MCV: 87.1 fL (ref 78.0–100.0)
Monocytes Absolute: 0.5 10*3/uL (ref 0.1–1.0)
Monocytes Relative: 6 %
Neutro Abs: 5.2 10*3/uL (ref 1.7–7.7)
Neutrophils Relative %: 68 %
Platelets: 510 10*3/uL — ABNORMAL HIGH (ref 150–400)
RBC: 4.66 MIL/uL (ref 4.22–5.81)
RDW: 13.2 % (ref 11.5–15.5)
WBC: 7.8 10*3/uL (ref 4.0–10.5)

## 2015-07-23 LAB — COMPREHENSIVE METABOLIC PANEL
ALT: 85 U/L — ABNORMAL HIGH (ref 17–63)
AST: 67 U/L — ABNORMAL HIGH (ref 15–41)
Albumin: 4.3 g/dL (ref 3.5–5.0)
Alkaline Phosphatase: 115 U/L (ref 38–126)
Anion gap: 9 (ref 5–15)
BUN: 6 mg/dL (ref 6–20)
CO2: 22 mmol/L (ref 22–32)
Calcium: 9.4 mg/dL (ref 8.9–10.3)
Chloride: 109 mmol/L (ref 101–111)
Creatinine, Ser: 0.92 mg/dL (ref 0.61–1.24)
GFR calc Af Amer: >60 mL/min (ref >60)
GFR calc non Af Amer: >60 mL/min (ref >60)
Glucose, Bld: 104 mg/dL — ABNORMAL HIGH (ref 65–99)
Potassium: 3.4 mmol/L — ABNORMAL LOW (ref 3.5–5.1)
Sodium: 140 mmol/L (ref 135–145)
Total Bilirubin: 1.3 mg/dL — ABNORMAL HIGH (ref 0.3–1.2)
Total Protein: 7.5 g/dL (ref 6.5–8.1)

## 2015-07-23 LAB — LIPASE, BLOOD: Lipase: 60 U/L — ABNORMAL HIGH (ref 11–51)

## 2015-07-23 MED ORDER — FAMOTIDINE IN NACL 20-0.9 MG/50ML-% IV SOLN
20.0000 mg | Freq: Once | INTRAVENOUS | Status: AC
Start: 2015-07-23 — End: 2015-07-23
  Administered 2015-07-23: 20 mg via INTRAVENOUS
  Filled 2015-07-23: qty 50

## 2015-07-23 MED ORDER — OXYCODONE-ACETAMINOPHEN 5-325 MG PO TABS: 1.0000 | ORAL_TABLET | ORAL | Status: DC | PRN

## 2015-07-23 MED ORDER — HYDROMORPHONE HCL 1 MG/ML IJ SOLN
1.0000 mg | Freq: Once | INTRAMUSCULAR | Status: AC
  Administered 2015-07-23: 1 mg via INTRAVENOUS
  Filled 2015-07-23: qty 1

## 2015-07-23 MED ORDER — MORPHINE SULFATE (PF) 4 MG/ML IV SOLN
6.0000 mg | Freq: Once | INTRAVENOUS | Status: AC
  Administered 2015-07-23: 6 mg via INTRAVENOUS
  Filled 2015-07-23: qty 2

## 2015-07-23 MED ORDER — ONDANSETRON HCL 4 MG/2ML IJ SOLN
4.0000 mg | Freq: Once | INTRAMUSCULAR | Status: AC
  Administered 2015-07-23: 4 mg via INTRAVENOUS
  Filled 2015-07-23: qty 2

## 2015-07-23 MED ORDER — ONDANSETRON HCL 4 MG PO TABS: 4.0000 mg | ORAL_TABLET | Freq: Four times a day (QID) | ORAL | Status: DC

## 2015-07-23 MED ORDER — SODIUM CHLORIDE 0.9 % IV BOLUS (SEPSIS)
1000.0000 mL | Freq: Once | INTRAVENOUS | Status: AC
  Administered 2015-07-23: 1000 mL via INTRAVENOUS

## 2015-07-23 MED ORDER — PROMETHAZINE HCL 25 MG/ML IJ SOLN
12.5000 mg | Freq: Once | INTRAMUSCULAR | Status: AC
  Administered 2015-07-23: 12.5 mg via INTRAVENOUS
  Filled 2015-07-23: qty 1

## 2015-07-23 NOTE — ED Notes (Signed)
Pt states that he stomach has been hurting for the past few days.  Also c/o n/v/d.

## 2015-07-23 NOTE — ED Notes (Signed)
Pt instructed not to drive until 4 hours after last dose of medication and to follow up with health department for his blood pressure,

## 2015-07-30 NOTE — ED Provider Notes (Signed)
CSN: [id number]     Arrival date & time 07/23/15  1256 History   First MD Initiated Contact with Patient 07/23/15 1304     Chief Complaint  Patient presents with  . Abdominal Pain     (Consider location/radiation/quality/duration/timing/severity/associated sxs/prior Treatment) HPI   29 year old male with abdominal pain. Gradual onset about 3 days ago. Since been constant progressive since then. Pain is in epigastrium. Sometimes radiates into his back. Patient has a past history of pancreatitis. States her current symptoms similar. Prior bouts of pancreatitis was related to alcohol use. Patient reports he has not had a drink about a week ago. Nausea but no vomiting. No urinary complaints. No fevers or chills. No sick contacts.   Past Medical History  Diagnosis Date  . Pancreatitis   . Pancreatitis    History reviewed. No pertinent past surgical history. Family History  Problem Relation Age of Onset  . Hypertension Father    Social History  Substance Use Topics  . Smoking status: Current Every Day Smoker -- 0.50 packs/day    Types: Cigarettes  . Smokeless tobacco: None  . Alcohol Use: Yes    Review of Systems  All systems reviewed and negative, other than as noted in HPI.   Allergies  Review of patient's allergies indicates no known allergies.  Home Medications   Prior to Admission medications   Medication Sig Start Date End Date Taking? Authorizing Provider  bismuth subsalicylate (PEPTO BISMOL) 262 MG/15ML suspension Take 30 mLs by mouth every 6 (six) hours as needed for indigestion or diarrhea or loose stools.   Yes Historical Provider, MD  dicyclomine (BENTYL) 20 MG tablet Take one every 6 hours for abdominal cramps 07/14/15  Yes Bethann Berkshire, MD  ondansetron (ZOFRAN ODT) 4 MG disintegrating tablet  ODT q4 hours prn nausea/vomit 07/14/15  Yes Bethann Berkshire, MD  metroNIDAZOLE (FLAGYL) 500 MG tablet Take 1 tablet (500 mg total) by mouth 2 (two) times daily. One po  bid x 7 days Patient not taking: Reported on 07/23/2015 07/14/15   Bethann Berkshire, MD  ondansetron (ZOFRAN) 4 MG tablet Take 1 tablet (4 mg total) by mouth every 6 (six) hours. 07/23/15   Raeford Razor, MD  oxyCODONE-acetaminophen (PERCOCET/ROXICET) 5-325 MG tablet Take 1-2 tablets by mouth every 4 (four) hours as needed for severe pain. 07/23/15   Raeford Razor, MD   BP 157/117 mmHg  Pulse 66  Temp(Src) 98 F (36.7 C) (Oral)  Resp 20  Ht  (1.6 m)  Wt 150 lb (68.04 kg)  BMI 26.58 kg/m2  SpO2 100% Physical Exam  Constitutional: He appears well-developed and well-nourished. No distress.  HENT:  Head: Normocephalic and atraumatic.  Eyes: Conjunctivae are normal. Right eye exhibits no discharge. Left eye exhibits no discharge.  Neck: Neck supple.  Cardiovascular: Normal rate, regular rhythm and normal heart sounds.  Exam reveals no gallop and no friction rub.   No murmur heard. Pulmonary/Chest: Effort normal and breath sounds normal. No respiratory distress.  Abdominal: Soft. He exhibits no distension. There is tenderness. There is no rebound and no guarding.  Musculoskeletal: He exhibits no edema or tenderness.  Neurological: He is alert.  Skin: Skin is warm and dry.  Psychiatric: He has a normal mood and affect. His behavior is normal. Thought content normal.  Nursing note and vitals reviewed.   ED Course  Procedures (including critical care time) Labs Review Labs Reviewed  CBC WITH DIFFERENTIAL/PLATELET - Abnormal; Notable for the following:    Platelets 510 (*)  All other components within normal limits  COMPREHENSIVE METABOLIC PANEL - Abnormal; Notable for the following:    Potassium 3.4 (*)    Glucose, Bld 104 (*)    AST 67 (*)    ALT 85 (*)    Total Bilirubin 1.3 (*)    All other components within normal limits  LIPASE, BLOOD - Abnormal; Notable for the following:    Lipase 60 (*)    All other components within normal limits    Imaging Review No results  found. I have personally reviewed and evaluated these images and lab results as part of my medical decision-making.   EKG Interpretation None      MDM   Final diagnoses:  Epigastric pain    29 year old male with abdominal pain. Past history of pancreatitis. Denies recent alcohol use though. Workup today does show mild elevation and lipase. Otherwise unremarkable. He is afebrile and medically stable. She was highly with improvement of symptoms.  Bland diet. Advance as tolerated. Continue symptomatic treatment. Return precautions discussed.    Raeford RazorStephen Priti Consoli, MD 07/30/15 [phone]

## 2015-08-07 ENCOUNTER — Encounter (HOSPITAL_COMMUNITY): Payer: Self-pay | Admitting: Emergency Medicine

## 2015-08-07 ENCOUNTER — Emergency Department (HOSPITAL_COMMUNITY)
Admission: EM | Admit: 2015-08-07 | Discharge: 2015-08-07 | Disposition: A | Discharge status: Home / Self Care | Payer: BLUE CROSS/BLUE SHIELD | Attending: Emergency Medicine | Admitting: Emergency Medicine

## 2015-08-07 DIAGNOSIS — R109 Unspecified abdominal pain: Secondary | ICD-10-CM

## 2015-08-07 DIAGNOSIS — G8929 Other chronic pain: Secondary | ICD-10-CM | POA: Diagnosis not present

## 2015-08-07 DIAGNOSIS — R1012 Left upper quadrant pain: Secondary | ICD-10-CM | POA: Insufficient documentation

## 2015-08-07 DIAGNOSIS — F17210 Nicotine dependence, cigarettes, uncomplicated: Secondary | ICD-10-CM | POA: Insufficient documentation

## 2015-08-07 DIAGNOSIS — R1013 Epigastric pain: Secondary | ICD-10-CM | POA: Diagnosis not present

## 2015-08-07 DIAGNOSIS — Z8719 Personal history of other diseases of the digestive system: Secondary | ICD-10-CM | POA: Diagnosis not present

## 2015-08-07 HISTORY — DX: Unspecified abdominal pain: R10.9

## 2015-08-07 HISTORY — DX: Other chronic pain: G89.29

## 2015-08-07 LAB — COMPREHENSIVE METABOLIC PANEL
ALT: 32 U/L (ref 17–63)
ANION GAP: 11 (ref 5–15)
AST: 36 U/L (ref 15–41)
Albumin: 3.9 g/dL (ref 3.5–5.0)
Alkaline Phosphatase: 105 U/L (ref 38–126)
BUN: 8 mg/dL (ref 6–20)
CALCIUM: 9.1 mg/dL (ref 8.9–10.3)
CHLORIDE: 108 mmol/L (ref 101–111)
CO2: 26 mmol/L (ref 22–32)
Creatinine, Ser: 0.91 mg/dL (ref 0.61–1.24)
Glucose, Bld: 96 mg/dL (ref 65–99)
Potassium: 3.5 mmol/L (ref 3.5–5.1)
SODIUM: 145 mmol/L (ref 135–145)
Total Bilirubin: 0.3 mg/dL (ref 0.3–1.2)
Total Protein: 7.6 g/dL (ref 6.5–8.1)

## 2015-08-07 LAB — CBC WITH DIFFERENTIAL/PLATELET
Basophils Absolute: 0.1 10*3/uL (ref 0.0–0.1)
Basophils Relative: 1 %
EOS ABS: 0.6 10*3/uL (ref 0.0–0.7)
EOS PCT: 9 %
HCT: 43.2 % (ref 39.0–52.0)
Hemoglobin: 14.6 g/dL (ref 13.0–17.0)
LYMPHS ABS: 3.3 10*3/uL (ref 0.7–4.0)
Lymphocytes Relative: 52 %
MCH: 29.6 pg (ref 26.0–34.0)
MCHC: 33.8 g/dL (ref 30.0–36.0)
MCV: 87.4 fL (ref 78.0–100.0)
MONO ABS: 0.3 10*3/uL (ref 0.1–1.0)
MONOS PCT: 5 %
Neutro Abs: 2.1 10*3/uL (ref 1.7–7.7)
Neutrophils Relative %: 33 %
PLATELETS: 502 10*3/uL — AB (ref 150–400)
RBC: 4.94 MIL/uL (ref 4.22–5.81)
RDW: 13.4 % (ref 11.5–15.5)
WBC: 6.4 10*3/uL (ref 4.0–10.5)

## 2015-08-07 LAB — LIPASE, BLOOD: LIPASE: 31 U/L (ref 11–51)

## 2015-08-07 MED ORDER — GI COCKTAIL ~~LOC~~
30.0000 mL | Freq: Once | ORAL | Status: AC
  Administered 2015-08-07: 30 mL via ORAL
  Filled 2015-08-07: qty 30

## 2015-08-07 MED ORDER — ONDANSETRON HCL 4 MG PO TABS: 4.0000 mg | ORAL_TABLET | Freq: Three times a day (TID) | ORAL | Status: DC | PRN

## 2015-08-07 MED ORDER — FAMOTIDINE 20 MG PO TABS
40.0000 mg | ORAL_TABLET | Freq: Once | ORAL | Status: AC
  Administered 2015-08-07: 40 mg via ORAL
  Filled 2015-08-07: qty 2

## 2015-08-07 MED ORDER — DICYCLOMINE HCL 10 MG/ML IM SOLN
20.0000 mg | Freq: Once | INTRAMUSCULAR | Status: AC
  Administered 2015-08-07: 20 mg via INTRAMUSCULAR
  Filled 2015-08-07: qty 2

## 2015-08-07 MED ORDER — DICYCLOMINE HCL 20 MG PO TABS: 20.0000 mg | ORAL_TABLET | Freq: Four times a day (QID) | ORAL | Status: DC | PRN

## 2015-08-07 NOTE — Discharge Instructions (Signed)
°Emergency Department Resource Guide °1) Find a Doctor and Pay Out of Pocket °Although you won't have to find out who is covered by your insurance plan, it is a good idea to ask around and get recommendations. You will then need to call the office and see if the doctor you have chosen will accept you as a new patient and what types of options they offer for patients who are self-pay. Some doctors offer discounts or will set up payment plans for their patients who do not have insurance, but you will need to ask so you aren't surprised when you get to your appointment. ° °2) Contact Your Local Health Department °Not all health departments have doctors that can see patients for sick visits, but many do, so it is worth a call to see if yours does. If you don't know where your local health department is, you can check in your phone book. The CDC also has a tool to help you locate your state's health department, and many state websites also have listings of all of their local health departments. ° °3) Find a Walk-in Clinic °If your illness is not likely to be very severe or complicated, you may want to try a walk in clinic. These are popping up all over the country in pharmacies, drugstores, and shopping centers. They're usually staffed by nurse practitioners or physician assistants that have been trained to treat common illnesses and complaints. They're usually fairly quick and inexpensive. However, if you have serious medical issues or chronic medical problems, these are probably not your best option. ° °No Primary Care Doctor: °- Call [facility] Connect at  832-8000 - they can help you locate a primary care doctor that  accepts your insurance, provides certain services, etc. °- Physician Referral Service- 1-800-533-3463 ° °Chronic Pain Problems: °Organization         Address  Phone   Notes  °[facility]  (336) 297-2271 Patients need to be referred by their primary care doctor.  ° °Medication  Assistance: °Organization         Address  Phone   Notes  °[place] [facility] [address] °[place], [place] [address] (336) 641-8030 --Must be a resident of [place] °-- Must have NO insurance coverage whatsoever (no Medicaid/ Medicare, etc.) °-- The pt. MUST have a primary care doctor that directs their care regularly and follows them in the community °  °[facility]  (866) 331-1348   °[employer]  (888) 892-1162   ° °Agencies that provide inexpensive medical care: °Organization         Address  Phone   Notes  °[facility]  (336) 832-8035   °[facility]    (336) 832-7272   °[facility] [address] °[place], [place] [address] (336) 832-4777   °[facility] [address], °[place] (336) 271-4999   °[facility]    (336) 373-0678   °[facility]    (336) 272-1050   °[facility] ° [address], [place] Phone:  (336) 832-4444, Fax:  (336) 832-4440 Hours of Operation:  9 am - 6 pm, M-F.  Also accepts Medicaid/Medicare and self-pay.  °[facility] ° [address], [place] Phone: (336) 832-3150, Fax: (336) [phone]. Hours of Operation:  8:30 am - 5:30 pm, M-F.  Also accepts Medicaid and self-pay.  °[facility] [place] [address], [place] Phone: (336) 878-6027   °[facility] [address], [place], [place] (336)723-1848, Ext. 123 Mondays & Thursdays: 7-9 AM.  First 15 patients are seen on a first come, first serve basis. °  ° °Medicaid-accepting [place] Providers: ° °Organization         Address  Phone   Notes  °[facility] [address], [place] (336) 641-2100 Also accepts self-pay patients.  °[facility] [address], [place] ° (336) 856-9996   °[facility] [address], [place]  (336) 288-8857   °[facility] [address] [place] [address], [place] (336) 299-7000   °Veita Bland [address], [place]  ° (336) 373-1557 Only accepts [place] Access Medicaid patients after they have their name applied to their card.  ° °Self-Pay (no insurance) in [place]: ° °Organization         Address  Phone   Notes  °Sickle Cell Patients, [facility] [address], [place] (336) 832-1970   °[facility] [address], [place] (336) 832-4400   °[facility] ° [address], [place] (336) 992-4800   °[facility]/Dr. Osei-Bonsu ° [address] [place] [address], [place] or [address], [place] (336) 841-8500 Phone number for both [place] and [place] locations is the same.  °[facility] [address], [place] (336) 299-0000   °[facility] [address] [place] [address], [place] or [address] (336) 852-7530 °(336) 878-2260   °[facility] [address], [place] (336) 350-1642, phone; (336) 294-5005, fax Sees patients 1st and 3rd Saturday of every month.  Must not qualify for public or private insurance (i.e. Medicaid, Medicare, [place] Health Choice, Veterans' Benefits) • Household income should be no more than 200% of the poverty level •The clinic cannot treat you if you are pregnant or think you are pregnant • Sexually transmitted diseases are not treated at the clinic.  ° ° °Dental Care: °Organization         Address  Phone  Notes  °[place] [facility] [address], [place] (336) 641-6152 Accepts children up to age 21 who are enrolled in Medicaid or [place] Health Choice; pregnant women with a Medicaid card; and children who have applied for Medicaid or [place] Health Choice, but were declined, whose parents can pay a reduced fee at time of service.  °[place]  [facility] [place]  [address], [place] (336) 641-7733 Accepts children up to age 21 who are enrolled in Medicaid or [place] Health Choice; pregnant women with a Medicaid card; and children who have applied for Medicaid or [place] Health Choice, but were declined, whose parents can pay a reduced fee at time of service.  °[facility] ° [address], [place] (336) 641-4533 Patients are seen by appointment only. Walk-ins are not accepted. [facility] will see patients 18 years of age and older. °Monday - Tuesday (8am-5pm) °Most Wednesdays (8:30-5pm) °$30 per visit, cash only  °[facility] ° [address], [place] (336) 641-4533 Patients are seen by appointment only. Walk-ins are not accepted. [facility] will see patients 18 years of age and older. °One   Wednesday Evening (Monthly: Volunteer Based).  $30 per visit, cash only  °[facility]  (919) 537-3737 for adults; Children under age 4, call Graduate Pediatric Dentistry at ([phone]) [phone]. Children aged 4-14, please call (919) [phone] to request a pediatric application. ° Dental services are provided in all areas of dental care including fillings, crowns and bridges, complete and partial dentures, implants, gum treatment, root canals, and extractions. Preventive care is also provided. Treatment is provided to both adults and children. °Patients are selected via a lottery and there is often a waiting list. °  °[facility] [address], °[place] ° (336) 763-8833 [email or web address] °  °[facility] [address], [place], [place] (336)723-1848, Ext. 123 Second and Fourth Thursday of each month, opens at 6:30 AM; Clinic ends at 9 AM.  Patients are seen on a first-come first-served basis, and a limited number are seen during each clinic.  ° °[facility] ° [address], [place], [place] (336) 723-7904    Eligibility Requirements °You must have lived in [place], [place], or [place] counties for at least the last three months. °  You cannot be eligible for state or federal sponsored healthcare insurance, including Veterans Administration, Medicaid, or Medicare. °  You generally cannot be eligible for healthcare insurance through your employer.  °  How to apply: °Eligibility screenings are held every Tuesday and Wednesday afternoon from 1:00 pm until 4:00 pm. You do not need an appointment for the interview!  °[facility] [address], [place], [place] 336-631-2330   °[facility]  336-342-8273   °[facility]  336-703-3100   °[facility]  336-570-6415   ° °Behavioral Health Resources in the Community: °Intensive Outpatient Programs °Organization         Address  Phone  Notes  °[facility] [address], [place], [place] 336-878-6098   °[facility] [address], [place], [place] 336-832-9800   °[facility] [address], [place], [place] ° 336-882-2125   °[facility] [address],  °[place], [place] 1-800-853-5163 or 336-641-4981   °Substance Abuse Resources °Organization         Address  Phone  Notes  °[facility]  336-882-2125   °[facility]  336-784-9470   °[facility]  336-285-9073   °[facility]  336-845-3988   °Residential & Outpatient Substance Abuse Program  1-800-659-3381   °Psychological Services °Organization         Address  Phone  Notes  °[facility]  336- 832-9600   °[facility]  336- 378-7881   °[facility] [address], [place] 1-800-853-5163 or 336-641-4981   ° °Mobile Crisis Teams °Organization         Address  Phone  Notes  °[facility], Mobile Crisis Care Unit  1-877-626-1772   °Assertive °[facility] ° [address]  [place], [place] 336-834-9664   °Sharon DeEsch [address] °[place] 336-554-5454   ° °Self-Help/Support Groups °Organization         Address  Phone             Notes  °[facility]  - variety of support groups  336- 373-1402 Call for more information  °Narcotics Anonymous (NA), [facility] [address], °[place]  2 meetings at this location  ° °  Residential Treatment Programs Organization         Address  Phone  Notes  [facility] [address],    [place]  [phone]-(830)[phone]   [facility]  [address], [place] [phone], [place], [place] [phone](810) [phone]   [facility] [address], [place] [phone])[phone] Admissions: 8am-3pm M-F  [facility] [address]    [place], [place] [phone]   [facility] [address], [place], [place] [phone]   [facility] [address]  [place], [place] [phone]   [facility] Intensive Outpatient [address], [place], [place] [phone]   [facility] [address]  [place], [place] [phone] or ([phone]   [facility] [address] [place], [place] [phone] Accepts Medicaid  [facility] [address]  [place] [phone] Substance Abuse/Addiction Treatment   [facility] Organization         Address  Phone  Notes  [facility]  (559)112-8483(888) [phone]   Angie FavaJulie Brannon, PhD [address], [address] [place], [place]   [phone](336) (856)[phone] or [phone](336) [phone]   [facility]   [address] [place], [place] [phone](336) [phone]   [facility] [address], [place], [place] 330-313-2727(336) [phone] Insurance/Medicaid/sponsorship through [facility] [address]                                    [place], [place] [phone](336) [phone] Therapy/tele-psych/case    [facility] [address]   [place], [place] [phone](336) [phone]    Dr. Lolly MustacheArfeen  [phone](336) [phone]   [facility] [place]  [employer] [facility] 1) [address], [place] 2) [address], [place] 3)  [address], [place] [phone](336) [phone](336) ([phone]  ([phone]) [phone](336) [phone]   [facility] [place] Child Abuse Hotline 732-059-2129(336) (786)757-9478 or (309) [phone](336) [phone] (After Hours)      Eat a bland diet, avoiding greasy, fatty, fried foods, as well as spicy and acidic foods or beverages.  Avoid eating within the hour or 2 before going to bed or laying down.  Also avoid teas, colas, coffee, chocolate, pepermint and spearment. Avoid alcohol. Take over the counter pepcid, one tablet by mouth twice a day, for the next 3 to 4 weeks.  May also take over the counter maalox/mylanta, as directed on packaging, as needed for discomfort.  Take the prescriptions as directed.  Call your regular medical doctor tomorrow to schedule a follow up appointment in the next 2 days. Call the GI doctor tomorrow to schedule a follow up appointment within the next week.  Return to the Emergency Department immediately if worsening.

## 2015-08-07 NOTE — ED Provider Notes (Signed)
CSN: [id number]     Arrival date & time 08/07/15  1332 History   First MD Initiated Contact with Patient 08/07/15 1507     Chief Complaint  Patient presents with  . Abdominal Pain      HPI  Pt was seen at 1515.  Per pt, c/o gradual onset and persistence of constant acute flair of his chronic upper abd "pain" for the past 2 days. Has been associated with multiple intermittent episodes of N/V/D.  Describes the abd pain as "sharp."  Symptoms began after he drank etoh.  Pt is concerned "I have pancreatitis again." Denies fevers, no back pain, no rash, no CP/SOB, no black or blood in stools or emesis. The symptoms have been associated with no other complaints. The patient has a significant history of similar symptoms previously, recently being evaluated for this complaint and multiple prior evals for same.      Past Medical History  Diagnosis Date  . Pancreatitis   . Chronic abdominal pain    History reviewed. No pertinent past surgical history.   Family History  Problem Relation Age of Onset  . Hypertension Father    Social History  Substance Use Topics  . Smoking status: Current Every Day Smoker -- 0.50 packs/day    Types: Cigarettes  . Smokeless tobacco: Never Used  . Alcohol Use: Yes    Review of Systems ROS: Statement: All systems negative except as marked or noted in the HPI; Constitutional: Negative for fever and chills. ; ; Eyes: Negative for eye pain, redness and discharge. ; ; ENMT: Negative for ear pain, hoarseness, nasal congestion, sinus pressure and sore throat. ; ; Cardiovascular: Negative for chest pain, palpitations, diaphoresis, dyspnea and peripheral edema. ; ; Respiratory: Negative for cough, wheezing and stridor. ; ; Gastrointestinal: +N/V/D, abd pain. Negative for blood in stool, hematemesis, jaundice and rectal bleeding. . ; ; Genitourinary: Negative for dysuria, flank pain and hematuria. ; ; Musculoskeletal: Negative for back pain and neck pain. Negative for  swelling and trauma.; ; Skin: Negative for pruritus, rash, abrasions, blisters, bruising and skin lesion.; ; Neuro: Negative for headache, lightheadedness and neck stiffness. Negative for weakness, altered level of consciousness , altered mental status, extremity weakness, paresthesias, involuntary movement, seizure and syncope.      Allergies  Review of patient's allergies indicates no known allergies.  Home Medications   Prior to Admission medications   Medication Sig Start Date End Date Taking? Authorizing Provider  bismuth subsalicylate (PEPTO BISMOL) 262 MG/15ML suspension Take 30 mLs by mouth every 6 (six) hours as needed for indigestion or diarrhea or loose stools.    Historical Provider, MD  dicyclomine (BENTYL) 20 MG tablet Take one every 6 hours for abdominal cramps 07/14/15   Bethann Berkshire, MD  metroNIDAZOLE (FLAGYL) 500 MG tablet Take 1 tablet (500 mg total) by mouth 2 (two) times daily. One po bid x 7 days Patient not taking: Reported on 07/23/2015 07/14/15   Bethann Berkshire, MD  ondansetron [facility] ODT) 4 MG disintegrating tablet  ODT q4 hours prn nausea/vomit 07/14/15   Bethann Berkshire, MD  ondansetron (ZOFRAN) 4 MG tablet Take 1 tablet (4 mg total) by mouth every 6 (six) hours. 07/23/15   Raeford Razor, MD  oxyCODONE-acetaminophen (PERCOCET/ROXICET) 5-325 MG tablet Take 1-2 tablets by mouth every 4 (four) hours as needed for severe pain. 07/23/15   Raeford Razor, MD   BP 154/81 mmHg  Pulse 103  Temp(Src) 98.6 F (37 C) (Oral)  Resp 15  Ht   (1.626 m)  Wt 150 lb (68.04 kg)  BMI 25.73 kg/m2  SpO2 100% Physical Exam  1520: Physical examination:  Nursing notes reviewed; Vital signs and O2 SAT reviewed;  Constitutional: Well developed, Well nourished, Well hydrated, In no acute distress; Head:  Normocephalic, atraumatic; Eyes: EOMI, PERRL, No scleral icterus; ENMT: Mouth and pharynx normal, Mucous membranes moist; Neck: Supple, Full range of motion, No lymphadenopathy;  Cardiovascular: Regular rate and rhythm, No murmur, rub, or gallop; Respiratory: Breath sounds clear & equal bilaterally, No rales, rhonchi, wheezes.  Speaking full sentences with ease, Normal respiratory effort/excursion; Chest: Nontender, Movement normal; Abdomen: Soft, +LUQ and mid-epigastric tenderness to palp. No rebound or guarding. Nondistended, Normal bowel sounds; Genitourinary: No CVA tenderness; Extremities: Pulses normal, No tenderness, No edema, No calf edema or asymmetry.; Neuro: AA&Ox3, Major CN grossly intact.  Speech clear. No gross focal motor or sensory deficits in extremities.; Skin: Color normal, Warm, Dry.   ED Course  Procedures (including critical care time) Labs Review   Imaging Review  I have personally reviewed and evaluated these images and lab results as part of my medical decision-making.   EKG Interpretation None      MDM  MDM Reviewed: previous chart, nursing note and vitals Reviewed previous: labs Interpretation: labs   Results for orders placed or performed during the hospital encounter of 08/07/15  CBC with Differential  Result Value Ref Range   WBC 6.4 4.0 - 10.5 K/uL   RBC 4.94 4.22 - 5.81 MIL/uL   Hemoglobin 14.6 13.0 - 17.0 g/dL   HCT 16.1 09.6 - 04.5 %   MCV 87.4 78.0 - 100.0 fL   MCH 29.6 26.0 - 34.0 pg   MCHC 33.8 30.0 - 36.0 g/dL   RDW 40.9 81.1 - 91.4 %   Platelets 502 (H) 150 - 400 K/uL   Neutrophils Relative % 33 %   Neutro Abs 2.1 1.7 - 7.7 K/uL   Lymphocytes Relative 52 %   Lymphs Abs 3.3 0.7 - 4.0 K/uL   Monocytes Relative 5 %   Monocytes Absolute 0.3 0.1 - 1.0 K/uL   Eosinophils Relative 9 %   Eosinophils Absolute 0.6 0.0 - 0.7 K/uL   Basophils Relative 1 %   Basophils Absolute 0.1 0.0 - 0.1 K/uL  Comprehensive metabolic panel  Result Value Ref Range   Sodium 145 135 - 145 mmol/L   Potassium 3.5 3.5 - 5.1 mmol/L   Chloride 108 101 - 111 mmol/L   CO2 26 22 - 32 mmol/L   Glucose, Bld 96 65 - 99 mg/dL   BUN 8 6 - 20  mg/dL   Creatinine, Ser 7.82 0.61 - 1.24 mg/dL   Calcium 9.1 8.9 - 95.6 mg/dL   Total Protein 7.6 6.5 - 8.1 g/dL   Albumin 3.9 3.5 - 5.0 g/dL   AST 36 15 - 41 U/L   ALT 32 17 - 63 U/L   Alkaline Phosphatase 105 38 - 126 U/L   Total Bilirubin 0.3 0.3 - 1.2 mg/dL   GFR calc non Af Amer >60 >60 mL/min   GFR calc Af Amer >60 >60 mL/min   Anion gap 11 5 - 15  Lipase, blood  Result Value Ref Range   Lipase 31 11 - 51 U/L    1600:  Pt denied drinking etoh to Triage RN, but did finally admit to me that he was drinking etoh before his symptoms began. Lipase normal today. Does not appear intoxicated. Has to PO well  while in the ED without N/V. No stooling while in the ED. Tx symptomatically, f/u GI MD. Dx and testing d/w pt.  Questions answered.  Verb understanding, agreeable to d/c home with outpt f/u.      Samuel JesterKathleen Constantine Ruddick, DO 08/10/15 1921

## 2015-08-07 NOTE — ED Notes (Signed)
Patient c/o upper abd pain with nausea, vomiting, and diarrhea. Denies any fevers. Patient has hx of pancreatitis and states pain feels the same. No active vomiting noted. Patient denies drinking any alcohol.

## 2015-10-19 ENCOUNTER — Emergency Department (HOSPITAL_COMMUNITY)
Admission: EM | Admit: 2015-10-19 | Discharge: 2015-10-19 | Disposition: A | Discharge status: Home / Self Care | Payer: PRIVATE HEALTH INSURANCE | Attending: Emergency Medicine | Admitting: Emergency Medicine

## 2015-10-19 ENCOUNTER — Encounter (HOSPITAL_COMMUNITY): Payer: Self-pay | Admitting: *Deleted

## 2015-10-19 DIAGNOSIS — I10 Essential (primary) hypertension: Secondary | ICD-10-CM | POA: Insufficient documentation

## 2015-10-19 DIAGNOSIS — K860 Alcohol-induced chronic pancreatitis: Secondary | ICD-10-CM | POA: Insufficient documentation

## 2015-10-19 DIAGNOSIS — Z79899 Other long term (current) drug therapy: Secondary | ICD-10-CM | POA: Insufficient documentation

## 2015-10-19 DIAGNOSIS — F17210 Nicotine dependence, cigarettes, uncomplicated: Secondary | ICD-10-CM | POA: Insufficient documentation

## 2015-10-19 LAB — CBC WITH DIFFERENTIAL/PLATELET
BASOS ABS: 0 10*3/uL (ref 0.0–0.1)
BASOS PCT: 0 %
EOS ABS: 0 10*3/uL (ref 0.0–0.7)
EOS PCT: 0 %
HCT: 40.3 % (ref 39.0–52.0)
Hemoglobin: 13.4 g/dL (ref 13.0–17.0)
Lymphocytes Relative: 15 %
Lymphs Abs: 2.1 10*3/uL (ref 0.7–4.0)
MCH: 28.9 pg (ref 26.0–34.0)
MCHC: 33.3 g/dL (ref 30.0–36.0)
MCV: 86.9 fL (ref 78.0–100.0)
Monocytes Absolute: 0.8 10*3/uL (ref 0.1–1.0)
Monocytes Relative: 5 %
Neutro Abs: 11.4 10*3/uL — ABNORMAL HIGH (ref 1.7–7.7)
Neutrophils Relative %: 80 %
PLATELETS: 386 10*3/uL (ref 150–400)
RBC: 4.64 MIL/uL (ref 4.22–5.81)
RDW: 12.5 % (ref 11.5–15.5)
WBC: 14.4 10*3/uL — AB (ref 4.0–10.5)

## 2015-10-19 LAB — COMPREHENSIVE METABOLIC PANEL
ALBUMIN: 4 g/dL (ref 3.5–5.0)
ALT: 36 U/L (ref 17–63)
AST: 44 U/L — AB (ref 15–41)
Alkaline Phosphatase: 100 U/L (ref 38–126)
Anion gap: 10 (ref 5–15)
BUN: 14 mg/dL (ref 6–20)
CHLORIDE: 106 mmol/L (ref 101–111)
CO2: 23 mmol/L (ref 22–32)
CREATININE: 1.02 mg/dL (ref 0.61–1.24)
Calcium: 8.5 mg/dL — ABNORMAL LOW (ref 8.9–10.3)
GFR calc non Af Amer: >60 mL/min (ref >60)
GLUCOSE: 110 mg/dL — AB (ref 65–99)
Potassium: 3.5 mmol/L (ref 3.5–5.1)
SODIUM: 139 mmol/L (ref 135–145)
Total Bilirubin: 1.5 mg/dL — ABNORMAL HIGH (ref 0.3–1.2)
Total Protein: 6.9 g/dL (ref 6.5–8.1)

## 2015-10-19 LAB — LIPASE, BLOOD: Lipase: 161 U/L — ABNORMAL HIGH (ref 11–51)

## 2015-10-19 MED ORDER — SODIUM CHLORIDE 0.9 % IV BOLUS (SEPSIS)
1000.0000 mL | Freq: Once | INTRAVENOUS | Status: AC
  Administered 2015-10-19: 1000 mL via INTRAVENOUS

## 2015-10-19 MED ORDER — ONDANSETRON 8 MG PO TBDP: 8.0000 mg | ORAL_TABLET | Freq: Three times a day (TID) | ORAL | Status: DC | PRN

## 2015-10-19 MED ORDER — ONDANSETRON HCL 4 MG/2ML IJ SOLN
4.0000 mg | Freq: Once | INTRAMUSCULAR | Status: AC
  Administered 2015-10-19: 4 mg via INTRAVENOUS
  Filled 2015-10-19: qty 2

## 2015-10-19 MED ORDER — OXYCODONE-ACETAMINOPHEN 5-325 MG PO TABS
2.0000 | ORAL_TABLET | Freq: Once | ORAL | Status: AC
  Administered 2015-10-19: 2 via ORAL
  Filled 2015-10-19: qty 2

## 2015-10-19 NOTE — ED Notes (Signed)
Pt states N/V/D x 2 days. Last vomited ~30 min PTA. Last had diarrhea earlier this morning, per pt. Generalized abdominal pain.

## 2015-10-19 NOTE — Discharge Instructions (Signed)
Acute Pancreatitis Acute pancreatitis is a disease in which the pancreas becomes suddenly irritated (inflamed). The pancreas is a large gland behind your stomach. The pancreas makes enzymes that help digest food. The pancreas also makes 2 hormones that help control your blood sugar. Acute pancreatitis happens when the enzymes attack and damage the pancreas. Most attacks last a couple of days and can cause serious problems. HOME CARE  Follow your doctor's diet instructions. You may need to avoid alcohol and limit fat in your diet.  Eat small meals often.  Drink enough fluids to keep your pee (urine) clear or pale yellow.  Only take medicines as told by your doctor.  Avoid drinking alcohol if it caused your disease.  Do not smoke.  Get plenty of rest.  Check your blood sugar at home as told by your doctor.  Keep all doctor visits as told. GET HELP IF:  You do not get better as quickly as expected.  You have new or worsening symptoms.  You have lasting pain, weakness, or feel sick to your stomach (nauseous).  You get better and then have another pain attack. GET HELP RIGHT AWAY IF:   You are unable to eat or keep fluids down.  Your pain becomes severe.  You have a fever or lasting symptoms for more than 2 to 3 days.  You have a fever and your symptoms suddenly get worse.  Your skin or the white part of your eyes turn yellow (jaundice).  You throw up (vomit).  You feel dizzy, or you pass out (faint).  Your blood sugar is high (over 300 mg/dL). MAKE SURE YOU:   Understand these instructions.  Will watch your condition.  Will get help right away if you are not doing well or get worse.   This information is not intended to replace advice given to you by your health care provider. Make sure you discuss any questions you have with your health care provider.   Document Released: 01/23/2008 Document Revised: 08/27/2014 Document Reviewed: 11/15/2011 Elsevier Interactive  Patient Education 2016 [employer]. Hypertension Hypertension is another name for high blood pressure. High blood pressure forces your heart to work harder to pump blood. A blood pressure reading has two numbers, which includes a higher number over a lower number (example: 110/72). HOME CARE   Have your blood pressure rechecked by your doctor.  Only take medicine as told by your doctor. Follow the directions carefully. The medicine does not work as well if you skip doses. Skipping doses also puts you at risk for problems.  Do not smoke.  Monitor your blood pressure at home as told by your doctor. GET HELP IF:  You think you are having a reaction to the medicine you are taking.  You have repeat headaches or feel dizzy.  You have puffiness (swelling) in your ankles.  You have trouble with your vision. GET HELP RIGHT AWAY IF:   You get a very bad headache and are confused.  You feel weak, numb, or faint.  You get chest or belly (abdominal) pain.  You throw up (vomit).  You cannot breathe very well. MAKE SURE YOU:   Understand these instructions.  Will watch your condition.  Will get help right away if you are not doing well or get worse.   This information is not intended to replace advice given to you by your health care provider. Make sure you discuss any questions you have with your health care provider.   Document Released:  01/23/2008 Document Revised: 08/11/2013 Document Reviewed: 05/29/2013 Elsevier Interactive Patient Education [employer].

## 2015-10-19 NOTE — ED Provider Notes (Signed)
CSN: [id number]     Arrival date & time 10/19/15  7829 History  By signing my name below, I, Tanda Rockers, attest that this documentation has been prepared under the direction and in the presence of Margarita Grizzle, MD. Electronically Signed: Tanda Rockers, ED Scribe. 10/19/2015. 9:55 AM.     Chief Complaint  Patient presents with  . Emesis   The history is provided by the patient. No language interpreter was used.     HPI Comments: Jeff Watts is a 30 y.o. male brought in by ambulance, with PMHx pancreatitis who presents to the Emergency Department complaining of gradual onset, constant, worsening, epigastric abdominal pain x 2 days. Pt also complains of nausea, vomiting, diarrhea, subjective fever, sweats, chills, and productive cough.  He estimates 5-6 episodes of non bloody, bilious emesis over the past 2 days. The last time he vomited was around 7 AM today (approximately 2.5 hours ago). Pt estimates 3 episodes of diarrhea since onset with the last episode last night. He has been taking Peptobismal without relief, last taken this morning. Pt has been attempting to stay hydrated but states he is unable to tolerate fluids. He has never had symptoms like this in the past. He has had recent sick contact with his brother who has similar symptoms about 1 week ago. Pt is occasional EtOH drinker. The last time he drank EtOH was 1.5 weeks ago. Denies sore throat or any other associated symptoms. Pt is current every day smoker. No previous abdominal surgeries.    Past Medical History  Diagnosis Date  . Pancreatitis   . Chronic abdominal pain    No past surgical history on file. Family History  Problem Relation Age of Onset  . Hypertension Father    Social History  Substance Use Topics  . Smoking status: Current Every Day Smoker -- 0.50 packs/day    Types: Cigarettes  . Smokeless tobacco: Never Used  . Alcohol Use: Yes    Review of Systems  Constitutional: Positive for fever (subjective),  chills and diaphoresis.  HENT: Negative for sore throat.   Respiratory: Positive for cough.   Gastrointestinal: Positive for nausea, vomiting, abdominal pain and diarrhea. Negative for blood in stool.  All other systems reviewed and are negative.     Allergies  Review of patient's allergies indicates no known allergies.  Home Medications   Prior to Admission medications   Medication Sig Start Date End Date Taking? Authorizing Provider  bismuth subsalicylate (PEPTO BISMOL) 262 MG/15ML suspension Take 30 mLs by mouth every 6 (six) hours as needed for indigestion or diarrhea or loose stools.    Historical Provider, MD  dicyclomine (BENTYL) 20 MG tablet Take 1 tablet (20 mg total) by mouth every 6 (six) hours as needed for spasms (abdominal cramping). 08/07/15   Samuel Jester, DO  metroNIDAZOLE (FLAGYL) 500 MG tablet Take 1 tablet (500 mg total) by mouth 2 (two) times daily. One po bid x 7 days Patient not taking: Reported on 07/23/2015 07/14/15   Bethann Berkshire, MD  ondansetron [facility] ODT) 4 MG disintegrating tablet  ODT q4 hours prn nausea/vomit 07/14/15   Bethann Berkshire, MD  ondansetron (ZOFRAN) 4 MG tablet Take 1 tablet (4 mg total) by mouth every 8 (eight) hours as needed for nausea or vomiting. 08/07/15   Samuel Jester, DO  oxyCODONE-acetaminophen (PERCOCET/ROXICET) 5-325 MG tablet Take 1-2 tablets by mouth every 4 (four) hours as needed for severe pain. 07/23/15   Raeford Razor, MD   There were no vitals taken  for this visit.   Physical Exam  Constitutional: He is oriented to person, place, and time. He appears well-developed and well-nourished.  hypertension  HENT:  Head: Normocephalic and atraumatic.  Right Ear: External ear normal.  Left Ear: External ear normal.  Nose: Nose normal.  Mouth/Throat: Oropharynx is clear and moist.  Eyes: Conjunctivae and EOM are normal. Pupils are equal, round, and reactive to light.  Neck: Normal range of motion. Neck supple.   Cardiovascular: Normal rate, regular rhythm, normal heart sounds and intact distal pulses.   Pulmonary/Chest: Effort normal and breath sounds normal. No respiratory distress. He has no wheezes. He exhibits no tenderness.  Abdominal: Soft. Bowel sounds are normal. He exhibits no distension and no mass. There is no tenderness. There is no guarding.  Mild epigastric ttp  Musculoskeletal: Normal range of motion.  Neurological: He is alert and oriented to person, place, and time. He has normal reflexes. He exhibits normal muscle tone. Coordination normal.  Skin: Skin is warm and dry.  Psychiatric: He has a normal mood and affect. His behavior is normal. Judgment and thought content normal.  Nursing note and vitals reviewed.   ED Course  Procedures (including critical care time)  DIAGNOSTIC STUDIES: Oxygen Saturation is 100% on RA, normal by my interpretation.    COORDINATION OF CARE: 9:49 AM-Discussed treatment plan which includes Zofran injection with pt at bedside and pt agreed to plan.   12:10 PM  - upon reevaluation, pt states he has been able to keep fluids down. Discussed elevated lipase level with pt. He states he may have drank EtOH sooner than 1.5 weeks ago. Discussed discharge instructions and return precautions with pt. Pt advised to return to the ED in case of worsening symptoms and specifically to avoid drinking EtOH. Pt understands and agrees with plan.   Labs Review Labs Reviewed  CBC WITH DIFFERENTIAL/PLATELET - Abnormal; Notable for the following:    WBC 14.4 (*)    Neutro Abs 11.4 (*)    All other components within normal limits  COMPREHENSIVE METABOLIC PANEL - Abnormal; Notable for the following:    Glucose, Bld 110 (*)    Calcium 8.5 (*)    AST 44 (*)    Total Bilirubin 1.5 (*)    All other components within normal limits  LIPASE, BLOOD - Abnormal; Notable for the following:    Lipase 161 (*)    All other components within normal limits    Imaging Review  MDM    Final diagnoses:  Alcohol-induced chronic pancreatitis (HCC)    I personally performed the services described in this documentation, which was scribed in my presence. The recorded information has been reviewed and considered.      Margarita Grizzle, MD 10/19/15 [phone]

## 2017-02-09 ENCOUNTER — Encounter (HOSPITAL_COMMUNITY): Payer: Self-pay

## 2017-02-09 ENCOUNTER — Inpatient Hospital Stay (HOSPITAL_COMMUNITY): Payer: Self-pay

## 2017-02-09 ENCOUNTER — Inpatient Hospital Stay (HOSPITAL_COMMUNITY)
Admission: EM | Admit: 2017-02-09 | Discharge: 2017-02-12 | DRG: 439 | Disposition: A | Discharge status: Home / Self Care | Payer: Self-pay | Source: Other Acute Inpatient Hospital | Attending: Internal Medicine | Admitting: Internal Medicine

## 2017-02-09 DIAGNOSIS — Z720 Tobacco use: Secondary | ICD-10-CM | POA: Diagnosis present

## 2017-02-09 DIAGNOSIS — K760 Fatty (change of) liver, not elsewhere classified: Secondary | ICD-10-CM | POA: Diagnosis present

## 2017-02-09 DIAGNOSIS — N179 Acute kidney failure, unspecified: Secondary | ICD-10-CM | POA: Diagnosis present

## 2017-02-09 DIAGNOSIS — K852 Alcohol induced acute pancreatitis without necrosis or infection: Principal | ICD-10-CM | POA: Diagnosis present

## 2017-02-09 DIAGNOSIS — Z79899 Other long term (current) drug therapy: Secondary | ICD-10-CM

## 2017-02-09 DIAGNOSIS — F1210 Cannabis abuse, uncomplicated: Secondary | ICD-10-CM | POA: Diagnosis present

## 2017-02-09 DIAGNOSIS — E860 Dehydration: Secondary | ICD-10-CM | POA: Diagnosis present

## 2017-02-09 DIAGNOSIS — E872 Acidosis: Secondary | ICD-10-CM | POA: Diagnosis present

## 2017-02-09 DIAGNOSIS — D649 Anemia, unspecified: Secondary | ICD-10-CM | POA: Diagnosis present

## 2017-02-09 DIAGNOSIS — F1010 Alcohol abuse, uncomplicated: Secondary | ICD-10-CM | POA: Diagnosis present

## 2017-02-09 DIAGNOSIS — F17210 Nicotine dependence, cigarettes, uncomplicated: Secondary | ICD-10-CM | POA: Diagnosis present

## 2017-02-09 HISTORY — DX: Other specified health status: Z78.9

## 2017-02-09 LAB — COMPREHENSIVE METABOLIC PANEL
ALBUMIN: 4.2 g/dL (ref 3.5–5.0)
ALT: 21 U/L (ref 17–63)
AST: 38 U/L (ref 15–41)
Alkaline Phosphatase: 112 U/L (ref 38–126)
Anion gap: 12 (ref 5–15)
BILIRUBIN TOTAL: 1 mg/dL (ref 0.3–1.2)
BUN: 27 mg/dL — AB (ref 6–20)
CHLORIDE: 100 mmol/L — AB (ref 101–111)
CO2: 22 mmol/L (ref 22–32)
CREATININE: 3.16 mg/dL — AB (ref 0.61–1.24)
Calcium: 8.6 mg/dL — ABNORMAL LOW (ref 8.9–10.3)
GFR calc Af Amer: 29 mL/min — ABNORMAL LOW (ref >60)
GFR, EST NON AFRICAN AMERICAN: 25 mL/min — AB (ref >60)
GLUCOSE: 101 mg/dL — AB (ref 65–99)
Potassium: 4.8 mmol/L (ref 3.5–5.1)
Sodium: 134 mmol/L — ABNORMAL LOW (ref 135–145)
TOTAL PROTEIN: 7.2 g/dL (ref 6.5–8.1)

## 2017-02-09 LAB — CBC WITH DIFFERENTIAL/PLATELET
Basophils Absolute: 0 10*3/uL (ref 0.0–0.1)
Basophils Relative: 0 %
EOS ABS: 0 10*3/uL (ref 0.0–0.7)
EOS PCT: 0 %
HCT: 39 % (ref 39.0–52.0)
Hemoglobin: 12.9 g/dL — ABNORMAL LOW (ref 13.0–17.0)
LYMPHS ABS: 1.5 10*3/uL (ref 0.7–4.0)
LYMPHS PCT: 8 %
MCH: 29.1 pg (ref 26.0–34.0)
MCHC: 33.1 g/dL (ref 30.0–36.0)
MCV: 88 fL (ref 78.0–100.0)
Monocytes Absolute: 1.3 10*3/uL — ABNORMAL HIGH (ref 0.1–1.0)
Monocytes Relative: 7 %
Neutro Abs: 16.8 10*3/uL — ABNORMAL HIGH (ref 1.7–7.7)
Neutrophils Relative %: 85 %
PLATELETS: 244 10*3/uL (ref 150–400)
RBC: 4.43 MIL/uL (ref 4.22–5.81)
RDW: 13 % (ref 11.5–15.5)
WBC: 19.6 10*3/uL — AB (ref 4.0–10.5)

## 2017-02-09 LAB — LACTIC ACID, PLASMA: Lactic Acid, Venous: 1 mmol/L (ref 0.5–1.9)

## 2017-02-09 LAB — MAGNESIUM: Magnesium: 1.9 mg/dL (ref 1.7–2.4)

## 2017-02-09 LAB — LIPASE, BLOOD: Lipase: 513 U/L — ABNORMAL HIGH (ref 11–51)

## 2017-02-09 LAB — ETHANOL

## 2017-02-09 LAB — PHOSPHORUS: Phosphorus: 4.5 mg/dL (ref 2.5–4.6)

## 2017-02-09 LAB — PROCALCITONIN: PROCALCITONIN: 0.24 ng/mL

## 2017-02-09 MED ORDER — LORAZEPAM 1 MG PO TABS: 1.0000 mg | ORAL_TABLET | Freq: Four times a day (QID) | ORAL | Status: AC | PRN

## 2017-02-09 MED ORDER — SODIUM CHLORIDE 0.9 % IV SOLN
INTRAVENOUS | Status: DC
Start: 2017-02-09 — End: 2017-02-12
  Administered 2017-02-09 – 2017-02-12 (×8): via INTRAVENOUS

## 2017-02-09 MED ORDER — FOLIC ACID 1 MG PO TABS: 1.0000 mg | ORAL_TABLET | Freq: Every day | ORAL | Status: DC

## 2017-02-09 MED ORDER — ENOXAPARIN SODIUM 30 MG/0.3ML ~~LOC~~ SOLN
30.0000 mg | SUBCUTANEOUS | Status: DC
  Administered 2017-02-09 – 2017-02-10 (×2): 30 mg via SUBCUTANEOUS
  Filled 2017-02-09 (×2): qty 0.3

## 2017-02-09 MED ORDER — IOPAMIDOL (ISOVUE-300) INJECTION 61%
INTRAVENOUS | Status: AC
  Administered 2017-02-09: 30 mL
  Filled 2017-02-09: qty 30

## 2017-02-09 MED ORDER — THIAMINE HCL 100 MG/ML IJ SOLN
100.0000 mg | Freq: Every day | INTRAMUSCULAR | Status: DC
  Administered 2017-02-09 – 2017-02-11 (×3): 100 mg via INTRAVENOUS
  Filled 2017-02-09 (×4): qty 2

## 2017-02-09 MED ORDER — PANTOPRAZOLE SODIUM 40 MG IV SOLR
40.0000 mg | Freq: Two times a day (BID) | INTRAVENOUS | Status: DC
Start: 2017-02-09 — End: 2017-02-12
  Administered 2017-02-09 – 2017-02-12 (×7): 40 mg via INTRAVENOUS
  Filled 2017-02-09 (×7): qty 40

## 2017-02-09 MED ORDER — ADULT MULTIVITAMIN W/MINERALS CH: 1.0000 | ORAL_TABLET | Freq: Every day | ORAL | Status: DC

## 2017-02-09 MED ORDER — NICOTINE 21 MG/24HR TD PT24
21.0000 mg | MEDICATED_PATCH | TRANSDERMAL | Status: DC
  Administered 2017-02-09 – 2017-02-12 (×4): 21 mg via TRANSDERMAL
  Filled 2017-02-09 (×4): qty 1

## 2017-02-09 MED ORDER — MORPHINE SULFATE (PF) 2 MG/ML IV SOLN
1.0000 mg | INTRAVENOUS | Status: DC | PRN
  Administered 2017-02-09: 4 mg via INTRAVENOUS
  Administered 2017-02-09: 2 mg via INTRAVENOUS
  Administered 2017-02-09 (×2): 4 mg via INTRAVENOUS
  Administered 2017-02-10 (×3): 2 mg via INTRAVENOUS
  Administered 2017-02-11: 4 mg via INTRAVENOUS
  Administered 2017-02-11 (×2): 2 mg via INTRAVENOUS
  Administered 2017-02-11: 4 mg via INTRAVENOUS
  Administered 2017-02-11: 2 mg via INTRAVENOUS
  Administered 2017-02-11: 1 mg via INTRAVENOUS
  Administered 2017-02-12 (×4): 2 mg via INTRAVENOUS
  Filled 2017-02-09 (×3): qty 1
  Filled 2017-02-09: qty 2
  Filled 2017-02-09 (×3): qty 1
  Filled 2017-02-09 (×2): qty 2
  Filled 2017-02-09: qty 1
  Filled 2017-02-09: qty 2
  Filled 2017-02-09 (×4): qty 1
  Filled 2017-02-09: qty 2
  Filled 2017-02-09 (×3): qty 1

## 2017-02-09 MED ORDER — LORAZEPAM 2 MG/ML IJ SOLN
0.0000 mg | Freq: Four times a day (QID) | INTRAMUSCULAR | Status: AC
Start: 2017-02-09 — End: 2017-02-11
  Administered 2017-02-09 (×2): 2 mg via INTRAVENOUS
  Filled 2017-02-09 (×2): qty 1

## 2017-02-09 MED ORDER — LORAZEPAM 2 MG/ML IJ SOLN
1.0000 mg | Freq: Four times a day (QID) | INTRAMUSCULAR | Status: AC | PRN
  Administered 2017-02-11: 1 mg via INTRAVENOUS
  Filled 2017-02-09: qty 1

## 2017-02-09 MED ORDER — ONDANSETRON HCL 4 MG/2ML IJ SOLN: 4.0000 mg | Freq: Four times a day (QID) | INTRAMUSCULAR | Status: DC | PRN

## 2017-02-09 MED ORDER — LORAZEPAM 2 MG/ML IJ SOLN
0.0000 mg | Freq: Two times a day (BID) | INTRAMUSCULAR | Status: DC
  Administered 2017-02-11: 2 mg via INTRAVENOUS
  Filled 2017-02-09: qty 1

## 2017-02-09 MED ORDER — PROMETHAZINE HCL 25 MG/ML IJ SOLN: 25.0000 mg | Freq: Four times a day (QID) | INTRAMUSCULAR | Status: DC | PRN

## 2017-02-09 MED ORDER — FOLIC ACID 5 MG/ML IJ SOLN
1.0000 mg | Freq: Every day | INTRAMUSCULAR | Status: DC
  Administered 2017-02-09 – 2017-02-12 (×3): 1 mg via INTRAVENOUS
  Filled 2017-02-09 (×5): qty 0.2

## 2017-02-09 MED ORDER — VITAMIN B-1 100 MG PO TABS
100.0000 mg | ORAL_TABLET | Freq: Every day | ORAL | Status: DC
  Administered 2017-02-12: 100 mg via ORAL
  Filled 2017-02-09 (×3): qty 1

## 2017-02-09 NOTE — Progress Notes (Signed)
Patient left for CT via wheelchair

## 2017-02-09 NOTE — H&P (Signed)
History and Physical    Jeff Watts ZHY:[id number] DOB: [birth year] DOA: 02/09/2017   PCP: Patient, No Pcp Per   Patient coming from/Resides with: Private residence/brother  Chief Complaint: Acute alcoholic pancreatitis with acute kidney injury  HPI: Jeff Watts is a 31 y.o. male with medical history significant for tobacco abuse and prior pancreatitis who presented to outside hospital with intractable nausea and vomiting with progressive but severe abdominal pain; clinical findings c/w acute pancreatitis (lipase of 1414). Was also found to have marked elevation in creatinine of 4.09 noting in March of this year creatinine was 1.02. Due to concerns of possible need for nephrology consultation (despite the fact the patient w/ adequate urinary output) request was made to transfer patient to this facility and he was accepted as a medical admission. Patient is having no respiratory symptoms or signs of iron overload.  ED Course: [facility] Vital Signs: BP 147/106 (BP Location: Right Arm)   Pulse 128   Temp 98.3 F (36.8 C) (Oral)   Resp 20   Ht 5\' 3"  (1.6 m)   Wt 57.8 kg (127 lb 8 oz)   BMI 22.59 kg/m  Lab data: Sodium 136, potassium 5.3, chloride 84, CO2 21.3, anion gap 36, BUN 27, creatinine 4.09, calcium 10.7, glucose 177, alkaline phosphatase 166 otherwise LFTs are normal, total bilirubin slightly elevated at 1.2, lipase 1414, white count 16,000 differential not obtained, hemoglobin 16.1, platelets 365,000, urine drug screen positive for cannabinoids, urinalysis abnormal with specific gravity > 1.030, 100 urine protein, cloudy appearance with orange color, 15 ketones, moderate blood, 20-30 RBCs, 5-10 WBCs, moderate bacteria, small calcium oxalate crystals Medications and treatments: Morphine 4 mg IV 3 doses, normal saline bolus 1 L  Review of Systems:  In addition to the HPI above,  No Fever-chills, myalgias or other constitutional symptoms No Headache, changes with Vision or  hearing, new weakness, tingling, numbness in any extremity, dizziness, dysarthria or word finding difficulty, gait disturbance or imbalance, tremors or seizure activity No problems swallowing food or Liquids, indigestion/reflux, choking or coughing while eating, abdominal pain with or after eating No Chest pain, Cough or Shortness of Breath, palpitations, orthopnea or DOE No melena,hematochezia, dark tarry stools, constipation No dysuria, malodorous urine, hematuria or flank pain No new skin rashes, lesions, masses or bruises, No new joint pains, aches, swelling or redness No recent unintentional weight gain or loss No polyuria, polydypsia or polyphagia   Past Medical History:  Diagnosis Date  . Chronic abdominal pain   . Pancreatitis     No past surgical history on file.  Social History   Social History  . Marital status: Single    Spouse name: N/A  . Number of children: N/A  . Years of education: N/A   Occupational History  . Not on file.   Social History Main Topics  . Smoking status: Current Every Day Smoker    Packs/day: 0.50    Types: Cigarettes  . Smokeless tobacco: Never Used  . Alcohol use Yes  . Drug use: Yes    Types: Marijuana  . Sexual activity: Not on file   Other Topics Concern  . Not on file   Social History Narrative  . 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 Hypertension Father    Family history reviewed -Grandfather died of complications related  to alcoholism  Prior to Admission medications   Medication Sig Start Date End Date Taking? Authorizing Provider  bismuth subsalicylate (PEPTO BISMOL) 262 MG/15ML suspension Take 30 mLs by mouth every 6 (six) hours as needed for indigestion or diarrhea or loose stools.    [provider]  ibuprofen (ADVIL,MOTRIN)  600 MG tablet Take 600 mg by mouth every 6 (six) hours as needed.    [provider]  ondansetron (ZOFRAN ODT) 8 MG disintegrating tablet Take 1 tablet (8 mg total) by mouth every 8 (eight) hours as needed for nausea or vomiting. 10/19/15   Margarita Grizzle, MD    Physical Exam: Vitals:   02/09/17 1050  BP: (!) 172/95  Pulse: 86  Resp: 20  Temp: 98.3 F (36.8 C)  TempSrc: Oral  Weight: 57.8 kg (127 lb 8 oz)  Height: 5\' 3"  (1.6 m)      Constitutional: NAD, calm, uncomfortable 2/2 ongoing abdominal pain Eyes: PERRL, lids and conjunctivae normal ENMT: Mucous membranes are dry. Posterior pharynx clear of any exudate or lesions.Normal dentition.  Neck: normal, supple, no masses, no thyromegaly Respiratory: clear to auscultation bilaterally, no wheezing, no crackles. Normal respiratory effort. No accessory muscle use.  Cardiovascular: Regular rate and rhythm, no murmurs / rubs / gallops. No extremity edema. 2+ pedal pulses. No carotid bruits.  Abdomen: Focal tenderness per abdomen especially left upper quadrant, no masses palpated. No appreciable hepatosplenomegaly at exam limited due to patient's abdominal pain with percussion. Bowel sounds positive and hyperactive. Abdomen somewhat distended Musculoskeletal: no clubbing / cyanosis. No joint deformity upper and lower extremities. Good ROM, no contractures. Normal muscle tone.  Skin: no rashes, lesions, ulcers. No induration Neurologic: CN 2-12 grossly intact. Sensation intact, DTR normal. Strength 5/5 x all 4 extremities.  Psychiatric: Normal judgment and insight. Alert and oriented x 3. Normal mood.    Labs on Admission: I have personally reviewed following labs and imaging studies  CBC: No results for input(s): WBC, NEUTROABS, HGB, HCT, MCV, PLT in the last 168 hours. Basic Metabolic Panel: No results for input(s): NA, K, CL, CO2, GLUCOSE, BUN, CREATININE, CALCIUM, MG, PHOS in the last 168 hours. GFR: CrCl cannot be calculated  (Patient's most recent lab result is older than the maximum 21 days allowed.). Liver Function Tests: No results for input(s): AST, ALT, ALKPHOS, BILITOT, PROT, ALBUMIN in the last 168 hours. No results for input(s): LIPASE, AMYLASE in the last 168 hours. No results for input(s): AMMONIA in the last 168 hours. Coagulation Profile: No results for input(s): INR, PROTIME in the last 168 hours. Cardiac Enzymes: No results for input(s): CKTOTAL, CKMB, CKMBINDEX, TROPONINI in the last 168 hours. BNP (last 3 results) No results for input(s): PROBNP in the last 8760 hours. HbA1C: No results for input(s): HGBA1C in the last 72 hours. CBG: No results for input(s): GLUCAP in the last 168 hours. Lipid Profile: No results for input(s): CHOL, HDL, LDLCALC, TRIG, CHOLHDL, LDLDIRECT in the last 72 hours. Thyroid Function Tests: No results for input(s): TSH, T4TOTAL, FREET4, T3FREE, THYROIDAB in the last 72 hours. Anemia Panel: No results for input(s): VITAMINB12, FOLATE, FERRITIN, TIBC, IRON, RETICCTPCT in the last 72 hours. Urine analysis:    Component Value Date/Time   COLORURINE YELLOW 07/09/2015 1419   APPEARANCEUR HAZY (A) 07/09/2015 1419   LABSPEC >1.030 (H) 07/09/2015 1419   PHURINE 6.0 07/09/2015 1419   GLUCOSEU NEGATIVE 07/09/2015 1419   HGBUR NEGATIVE 07/09/2015 1419   BILIRUBINUR NEGATIVE 07/09/2015 1419   KETONESUR 15 (A)  07/09/2015 1419   PROTEINUR 30 (A) 07/09/2015 1419   UROBILINOGEN 0.2 07/20/2013 0028   NITRITE NEGATIVE 07/09/2015 1419   LEUKOCYTESUR NEGATIVE 07/09/2015 1419   Sepsis Labs: @LABRCNTIP (procalcitonin:4,lacticidven:4) )No results found for this or any previous visit (from the past 240 hour(s)).   Radiological Exams on Admission: No results found.  EKG: (Independently reviewed) Sinus rhythm with ventricular rate 90 bpm, QTC 452 ms, slightly elevated J point in inferior leads otherwise nonischemic EKG  Assessment/Plan Principal Problem:   Acute alcoholic  pancreatitis -Patient presents from outside facility with significant abdominal pain associated with marked nausea and nonbloody emesis and dehydration with elevation of lipase 1414 after ingestion of alcoholic beverages -Patient reports does not drink daily and this past Thursday drank a 24 ounce beer and woke up Friday with abdominal pain that became progressively worse throughout the day -CIWA -NPO For ice chips and bowel rest, aggressive IV fluid hydration -Repeat electrolytes and lipase now -CT abdomen and pelvis with oral contrast only to rule out pancreatic necrosis or pancreatic pseudocyst -Supportive care with Zofran/Phenergan -IV morphine for pain; unable to utilize NSAIDs secondary to acute kidney injury -Counseled regarding cessation of alcohol -UDS positive for THC -Alcohol level  Active Problems:   Acute kidney injury/metabolic acidosis  -Baseline renal function March 2017 creatinine 1.02 with current creatinine at presentation to outside ER 4.09 -Repeat electrolytes now to determine if hydration has improved renal function -Patient admits to infrequent utilization of NSAIDs and hasn't used any in the past several days -Avoid offending medications -If anion gap remains elevated may require bicarbonate infusion -Hydrate as above ** Cr down to 3.16 after hydration    Dehydration, moderate -Secondary to intractable nausea and vomiting prior to presentation -Clinical presentation with physical exam, acute kidney injury and hemoconcentration as evidenced by elevated hemoglobin (baseline 13.4 >>16) -Hemodynamically stable    Tobacco abuse -Nicotine patch -Counseled regarding cessation      DVT prophylaxis: Lovenox with dose adjusted secondary to low GFR Code Status: Full Family Communication: No family at bedside  Disposition Plan: Home Consults called: None    Mishael Krysiak L. ANP-BC [facility] Pager [phone]   If 7PM-7AM, please contact  night-coverage [email or web address] Password [facility]  02/09/2017, 11:19 AM

## 2017-02-09 NOTE — Progress Notes (Signed)
Karl Harland DingwallSettle is a 31 y.o. male patient admitted from ED awake, alert - oriented  X 4 - has abdominal pain.  VSS - Blood pressure (!) 153/94, pulse 96, temperature 98 F (36.7 C), resp. rate 16, height 5\' 3"  (1.6 m), weight 57.8 kg (127 lb 8 oz), SpO2 100 %.    IV in place, occlusive dsg intact without redness.  Orientation to room, and floor completed with information packet given to patient/family.  Patient declined safety video at this time.  Admission INP armband ID verified with patient/family, and in place.   SR up x 2, fall assessment complete, with patient and family able to verbalize understanding of risk associated with falls, and verbalized understanding to call nsg before up out of bed.  Call light within reach, patient able to voice, and demonstrate understanding.  Skin, clean-dry- intact without evidence of bruising, or skin tears.   No evidence of skin break down noted on exam.     Will cont to eval and treat per MD orders.  Evern BioLoren D Kameren Pargas, RN 02/09/2017 6:53 PM

## 2017-02-10 LAB — CBC
HCT: 35.4 % — ABNORMAL LOW (ref 39.0–52.0)
Hemoglobin: 11.3 g/dL — ABNORMAL LOW (ref 13.0–17.0)
MCH: 29 pg (ref 26.0–34.0)
MCHC: 31.9 g/dL (ref 30.0–36.0)
MCV: 91 fL (ref 78.0–100.0)
Platelets: 203 10*3/uL (ref 150–400)
RBC: 3.89 MIL/uL — AB (ref 4.22–5.81)
RDW: 13.4 % (ref 11.5–15.5)
WBC: 11.4 10*3/uL — ABNORMAL HIGH (ref 4.0–10.5)

## 2017-02-10 LAB — COMPREHENSIVE METABOLIC PANEL
ALT: 15 U/L — AB (ref 17–63)
ANION GAP: 10 (ref 5–15)
AST: 42 U/L — ABNORMAL HIGH (ref 15–41)
Albumin: 3.6 g/dL (ref 3.5–5.0)
Alkaline Phosphatase: 90 U/L (ref 38–126)
BILIRUBIN TOTAL: 1.1 mg/dL (ref 0.3–1.2)
BUN: 21 mg/dL — ABNORMAL HIGH (ref 6–20)
CALCIUM: 8.8 mg/dL — AB (ref 8.9–10.3)
CO2: 22 mmol/L (ref 22–32)
CREATININE: 1.85 mg/dL — AB (ref 0.61–1.24)
Chloride: 103 mmol/L (ref 101–111)
GFR calc non Af Amer: 48 mL/min — ABNORMAL LOW (ref >60)
GFR, EST AFRICAN AMERICAN: 56 mL/min — AB (ref >60)
Glucose, Bld: 74 mg/dL (ref 65–99)
Potassium: 4.4 mmol/L (ref 3.5–5.1)
Sodium: 135 mmol/L (ref 135–145)
TOTAL PROTEIN: 6.2 g/dL — AB (ref 6.5–8.1)

## 2017-02-10 LAB — HIV ANTIBODY (ROUTINE TESTING W REFLEX): HIV Screen 4th Generation wRfx: NONREACTIVE

## 2017-02-10 LAB — LIPASE, BLOOD: LIPASE: 122 U/L — AB (ref 11–51)

## 2017-02-10 MED ORDER — ENOXAPARIN SODIUM 40 MG/0.4ML ~~LOC~~ SOLN
40.0000 mg | SUBCUTANEOUS | Status: DC
  Administered 2017-02-11 – 2017-02-12 (×2): 40 mg via SUBCUTANEOUS
  Filled 2017-02-10 (×2): qty 0.4

## 2017-02-10 NOTE — Plan of Care (Signed)
Problem: Nutrition: Goal: Adequate nutrition will be maintained Outcome: Not Progressing Patient is NPO.  Pt complains of abdominal pain, unable to tolerate PO intake.  Pt is not progressing towards goal.

## 2017-02-10 NOTE — Progress Notes (Signed)
Patient slept well last night, CIWAs scores remained unchanged at 0.  No ativan needed on PM shift.  Patient received morphine 2mg  twice last pm for abdominal pain.  No other changes in assessment noted.  All questions and concerns addressed.  Will continue to monitor and report off to day RN.

## 2017-02-10 NOTE — Clinical Social Work Note (Signed)
CSW was consulted for SA resources. CSW provided pt with resources in [place] to assist with SA. Pt accepting and appreciative. Pt denies any further CSW needs at this time. Clinical Social Worker will sign off for now as social work intervention is no longer needed. Please consult us again if new need arises.   Clarisse GougeBridget A Macio Kissoon 02/10/2017

## 2017-02-10 NOTE — Progress Notes (Signed)
PROGRESS NOTE                                                                                                                                                                                                             Patient Demographics:    Jeff Watts, is a 31 y.o. male, DOB - [birth year], ZOX:[id number]  Admit date - 02/09/2017   Admitting Physician Clydie Braun, MD  Outpatient Primary MD for the patient is Patient, No Pcp Per  LOS - 1  No chief complaint on file.      Brief Narrative   Jeff Watts is a 31 y.o. 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   Assessment  & Plan :    Principal Problem:   Acute alcoholic pancreatitis Active Problems:   Acute kidney injury (HCC)   Dehydration, moderate   Tobacco abuse  Acute alcoholic pancreatitis - -Patient presents from outside facility with significant abdominal pain associated with marked nausea and nonbloody emesis and dehydration with elevation of lipase 1414 after ingestion of alcoholic beverages - Unfortunately this seems to be recurrent problem, patient was educated about the importance of alcohol abstinence. - No further nausea or vomiting, but continues to have significant abdominal pain, so we'll still keep nothing by mouth, continue with aggressive IV fluids, continue with when necessary pain medication.  AK I/metabolic acidosis - Baseline creatinine is 1, was 4.09 on admission, improving with IV fluids, to neutral avoid nephrotoxic medication, creatinine is 1.8 today, bicarbonate within normal limits  Polysubstance abuse - Continue with CIWA  protocol for alcohol abuse, he was counseled - Urine drug screen significant for THC  Tobacco abuse - Continue with nicotine patch  Code Status : Full  Family Communication  : None at bedside  Disposition Plan  : Home  Consults  :  None  Procedures  : none  DVT Prophylaxis  :  Lovenox   Lab Results  Component Value Date   PLT 203 02/10/2017    Antibiotics  :    Anti-infectives  None        Objective:   Vitals:   02/09/17 1050 02/09/17 1448 02/10/17 0500 02/10/17 0554  BP: (!) 172/95 (!) 153/94  139/82  Pulse: 86 96  72  Resp: 20 16  20   Temp: 98.3 F (36.8 C) 98 F (36.7 C)  98.5 F (36.9 C)  TempSrc: Oral   Oral  SpO2:  100%  100%  Weight: 57.8 kg (127 lb 8 oz)  61.3 kg (135 lb 3.2 oz)   Height: 5\' 3"  (1.6 m)       Wt Readings from Last 3 Encounters:  02/10/17 61.3 kg (135 lb 3.2 oz)  10/19/15 70.3 kg (155 lb)  08/07/15 68 kg (150 lb)     Intake/Output Summary (Last 24 hours) at 02/10/17 1146 Last data filed at 02/10/17 0900  Gross per 24 hour  Intake           1722.5 ml  Output              400 ml  Net           1322.5 ml     Physical Exam  Awake Alert, Oriented X 3, No new F.N deficits, Normal affect,No tremors NSymmetrical Chest wall movement, Good air movement bilaterally, CTAB RRR,No Gallops,Rubs or new Murmurs, No Parasternal Heave +ve B.Sounds, Abd Soft, mild epigastric tenderness, No rebound - guarding or rigidity. No Cyanosis, Clubbing or edema, No new Rash or bruise     Data Review:    CBC  Recent Labs Lab 02/09/17 1126 02/10/17 0528  WBC 19.6* 11.4*  HGB 12.9* 11.3*  HCT 39.0 35.4*  PLT 244 203  MCV 88.0 91.0  MCH 29.1 29.0  MCHC 33.1 31.9  RDW 13.0 13.4  LYMPHSABS 1.5  --   MONOABS 1.3*  --   EOSABS 0.0  --   BASOSABS 0.0  --     Chemistries   Recent Labs Lab 02/09/17 1126 02/10/17 0528  NA 134* 135  K 4.8 4.4  CL 100* 103  CO2 22 22  GLUCOSE 101* 74  BUN 27* 21*  CREATININE 3.16* 1.85*  CALCIUM  8.6* 8.8*  MG 1.9  --   AST 38 42*  ALT 21 15*  ALKPHOS 112 90  BILITOT 1.0 1.1   ------------------------------------------------------------------------------------------------------------------ No results for input(s): CHOL, HDL, LDLCALC, TRIG, CHOLHDL, LDLDIRECT in the last 72 hours.  Lab Results  Component Value Date   HGBA1C 5.3 07/19/2013   ------------------------------------------------------------------------------------------------------------------ No results for input(s): TSH, T4TOTAL, T3FREE, THYROIDAB in the last 72 hours.  Invalid input(s): FREET3 ------------------------------------------------------------------------------------------------------------------ No results for input(s): VITAMINB12, FOLATE, FERRITIN, TIBC, IRON, RETICCTPCT in the last 72 hours.  Coagulation profile No results for input(s): INR, PROTIME in the last 168 hours.  No results for input(s): DDIMER in the last 72 hours.  Cardiac Enzymes No results for input(s): CKMB, TROPONINI, MYOGLOBIN in the last 168 hours.  Invalid input(s): CK ------------------------------------------------------------------------------------------------------------------ No results found for: BNP  Inpatient Medications  Scheduled Meds: . enoxaparin (LOVENOX) injection  30 mg Subcutaneous Q24H  . folic acid  1 mg Intravenous Daily  . LORazepam  0-4 mg Intravenous Q6H   Followed by  . [START ON 02/11/2017] LORazepam  0-4 mg Intravenous Q12H  . nicotine  21 mg Transdermal Q24H  . pantoprazole (PROTONIX) IV  40 mg Intravenous Q12H  . thiamine  100 mg Oral Daily   Or  . thiamine  100 mg Intravenous Daily   Continuous Infusions: . sodium chloride 150  mL/hr at 02/10/17 0403   PRN Meds:.LORazepam **OR** LORazepam, morphine injection, ondansetron (ZOFRAN) IV **OR** promethazine  Micro Results No results found for this or any previous visit (from the past 240 hour(s)).  Radiology Reports Ct Abdomen Pelvis Wo  Contrast  Result Date: 02/09/2017 CLINICAL DATA:  31 year old male with acute on chronic pancreatitis and epigastric pain. EXAM: CT ABDOMEN AND PELVIS WITHOUT CONTRAST TECHNIQUE: Multidetector CT imaging of the abdomen and pelvis was performed following the standard protocol without IV contrast. COMPARISON:  Multiple prior CTs of the abdomen and pelvis dating back to 07/04/2013. FINDINGS: Evaluation of this exam is limited in the absence of intravenous contrast. Lower chest: The visualized lung bases are clear. No intra-abdominal free air or free fluid. Hepatobiliary: There is diffuse fatty infiltration of the liver. No intrahepatic biliary ductal dilatation. The gallbladder is mildly distended. No calcified stone. Pancreas: There is coarse calcification of the head and uncinate process of the pancreas dating back to 04/18/2016 sequela of chronic pancreatitis. There is mild thickened appearance of the pancreatic parenchyma with mild surrounding haziness which may represent chronic changes/scarring or acute on chronic pancreatitis. Correlation with pancreatic enzymes recommended. There is no drainable fluid collection, abscess or pseudocyst. There is mild dilatation of the main pancreatic duct as seen on the prior CT, sequela of chronic pancreatitis. Spleen: Normal in size without focal abnormality. Adrenals/Urinary Tract: Adrenal glands are unremarkable. Kidneys are normal, without renal calculi, focal lesion, or hydronephrosis. Bladder is unremarkable. Stomach/Bowel: Stomach is within normal limits. Appendix appears normal. No evidence of bowel wall thickening, distention, or inflammatory changes. Vascular/Lymphatic: The abdominal aorta and IVC are grossly unremarkable on this noncontrast study. No portal venous gas identified. There is no adenopathy. Reproductive: The prostate and seminal vesicles are grossly unremarkable. Other: Small fat containing umbilical hernia. Small pockets of subcutaneous air in the  right anterior abdomen likely related to recent subcutaneous injection. Musculoskeletal: No acute or significant osseous findings. IMPRESSION: 1. Coarse calcification of the uncinate process and head of the pancreas sequela of chronic pancreatitis. There is no mild peripancreatic haziness which may be chronic in nature or represent acute on chronic pancreatitis. Correlation with pancreatic enzymes recommended. There is no drainable fluid collection/abscess or pseudocyst. 2. Fatty liver. 3. No bowel obstruction or active inflammation.  Normal appendix. Electronically Signed   By: Elgie Collard M.D.   On: 02/09/2017 Araceli Bouche, Georgian Mcclory M.D on 02/10/2017 at 11:46 AM  Between 7am to 7pm - Pager - [phone]  After 7pm go to [email or web address] - password [facility]  [facility] -  Office  [phone]

## 2017-02-11 ENCOUNTER — Encounter (HOSPITAL_COMMUNITY): Payer: Self-pay

## 2017-02-11 LAB — CBC
HCT: 36 % — ABNORMAL LOW (ref 39.0–52.0)
HEMOGLOBIN: 11.3 g/dL — AB (ref 13.0–17.0)
MCH: 28.8 pg (ref 26.0–34.0)
MCHC: 31.4 g/dL (ref 30.0–36.0)
MCV: 91.6 fL (ref 78.0–100.0)
Platelets: 166 10*3/uL (ref 150–400)
RBC: 3.93 MIL/uL — AB (ref 4.22–5.81)
RDW: 12.6 % (ref 11.5–15.5)
WBC: 8.2 10*3/uL (ref 4.0–10.5)

## 2017-02-11 LAB — BASIC METABOLIC PANEL
Anion gap: 8 (ref 5–15)
BUN: 13 mg/dL (ref 6–20)
CHLORIDE: 106 mmol/L (ref 101–111)
CO2: 24 mmol/L (ref 22–32)
Calcium: 8.8 mg/dL — ABNORMAL LOW (ref 8.9–10.3)
Creatinine, Ser: 1.4 mg/dL — ABNORMAL HIGH (ref 0.61–1.24)
GFR calc Af Amer: >60 mL/min (ref >60)
GFR calc non Af Amer: >60 mL/min (ref >60)
GLUCOSE: 96 mg/dL (ref 65–99)
Potassium: 3.9 mmol/L (ref 3.5–5.1)
Sodium: 138 mmol/L (ref 135–145)

## 2017-02-11 LAB — PROCALCITONIN: Procalcitonin: <0.1 ng/mL

## 2017-02-11 NOTE — Progress Notes (Signed)
PROGRESS NOTE                                                                                                                                                                                                             Patient Demographics:    Jeff Watts, is a 31 y.o. male, DOB - November 09, 1985, ZOX:[id number]  Admit date - 02/09/2017   Admitting Physician Clydie Braunondell A Smith, MD  Outpatient Primary MD for the patient is Patient, No Pcp Per  LOS - 2  No chief complaint on file.      Brief Narrative   Jeff Watts is a 31 y.o. 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   Assessment  & Plan :    Principal Problem:   Acute alcoholic pancreatitis Active Problems:   Acute kidney injury (HCC)   Dehydration, moderate   Tobacco abuse  Acute alcoholic pancreatitis - -Patient presents from outside facility with significant abdominal pain associated with marked nausea and nonbloody emesis and dehydration with elevation of lipase 1414 after ingestion of alcoholic beverages - Unfortunately this seems to be recurrent problem, patient was educated about the importance of alcohol abstinence. - Report is feeling much better today, no further nausea or vomiting, abdominal pain significantly subsided, started on clear liquid diet this a.m., he tolerated well, advanced to full liquid diet, hopefully if he continues to improve we'll change to soft diet tomorrow. - Cont with IV fluids and when necessary nausea and pain medication   AK I/metabolic acidosis - Baseline creatinine is 1, was 4.09 on admission, improving with IV fluids,  avoid nephrotoxic medication, creatinine is  1.4 today.  Polysubstance abuse - Continue with [facility] protocol for alcohol abuse, he was counseled - Urine drug screen significant for THC  Tobacco abuse - Continue with nicotine patch  Code Status : Full  Family Communication  : None at bedside  Disposition Plan  : Home  Consults  :  None  Procedures  : none  DVT Prophylaxis  :  Lovenox   Lab Results  Component Value Date  PLT 166 02/11/2017    Antibiotics  :    Anti-infectives    None        Objective:   Vitals:   02/11/17 0141 02/11/17 0231 02/11/17 0538 02/11/17 1425  BP: (!) 168/110 134/84 (!) 156/104 (!) 151/98  Pulse: 68 (!) 59 63 80  Resp:   18 18  Temp:   97.7 F (36.5 C) 98.2 F (36.8 C)  TempSrc:   Oral   SpO2:   100% 100%  Weight:      Height:        Wt Readings from Last 3 Encounters:  02/10/17 61.3 kg (135 lb 3.2 oz)  10/19/15 70.3 kg (155 lb)  08/07/15 68 kg (150 lb)    No intake or output data in the 24 hours ending 02/11/17 1542   Physical Exam  Awake, alert oriented 3  Good air entry bilaterally, clear to auscultation  Regular rate and rhythm, no rubs murmurs gallops  Abdomen soft, nontender, nondistended, bowel sounds present  Extremities with no edema, clubbing or cyanosis    Data Review:    CBC  Recent Labs Lab 02/09/17 1126 02/10/17 0528 02/11/17 0631  WBC 19.6* 11.4* 8.2  HGB 12.9* 11.3* 11.3*  HCT 39.0 35.4* 36.0*  PLT 244 203 166  MCV 88.0 91.0 91.6  MCH 29.1 29.0 28.8  MCHC 33.1 31.9 31.4  RDW 13.0 13.4 12.6  LYMPHSABS 1.5  --   --   MONOABS 1.3*  --   --   EOSABS 0.0  --   --   BASOSABS 0.0  --   --     Chemistries   Recent Labs Lab 02/09/17 1126 02/10/17 0528 02/11/17 0631  NA 134* 135 138  K 4.8 4.4 3.9  CL 100* 103 106  CO2 22 22 24   GLUCOSE 101* 74 96  BUN 27* 21* 13  CREATININE 3.16* 1.85* 1.40*  CALCIUM 8.6* 8.8* 8.8*  MG 1.9  --   --   AST 38 42*  --   ALT 21 15*  --   ALKPHOS 112 90  --   BILITOT 1.0 1.1  --     ------------------------------------------------------------------------------------------------------------------ No results for input(s): CHOL, HDL, LDLCALC, TRIG, CHOLHDL, LDLDIRECT in the last 72 hours.  Lab Results  Component Value Date   HGBA1C 5.3 07/19/2013   ------------------------------------------------------------------------------------------------------------------ No results for input(s): TSH, T4TOTAL, T3FREE, THYROIDAB in the last 72 hours.  Invalid input(s): FREET3 ------------------------------------------------------------------------------------------------------------------ No results for input(s): VITAMINB12, FOLATE, FERRITIN, TIBC, IRON, RETICCTPCT in the last 72 hours.  Coagulation profile No results for input(s): INR, PROTIME in the last 168 hours.  No results for input(s): DDIMER in the last 72 hours.  Cardiac Enzymes No results for input(s): CKMB, TROPONINI, MYOGLOBIN in the last 168 hours.  Invalid input(s): CK ------------------------------------------------------------------------------------------------------------------ No results found for: BNP  Inpatient Medications  Scheduled Meds: . enoxaparin (LOVENOX) injection  40 mg Subcutaneous Q24H  . folic acid  1 mg Intravenous Daily  . LORazepam  0-4 mg Intravenous Q12H  . nicotine  21 mg Transdermal Q24H  . pantoprazole (PROTONIX) IV  40 mg Intravenous Q12H  . thiamine  100 mg Oral Daily   Or  . thiamine  100 mg Intravenous Daily   Continuous Infusions: . sodium chloride 150 mL/hr at 02/11/17 1324   PRN Meds:.LORazepam **OR** LORazepam, morphine injection, ondansetron (ZOFRAN) IV **OR** promethazine  Micro Results No results found for this or any previous visit (from the past 240 hour(s)).  Radiology Reports  Ct Abdomen Pelvis Wo Contrast  Result Date: 02/09/2017 CLINICAL DATA:  31 year old male with acute on chronic pancreatitis and epigastric pain. EXAM: CT ABDOMEN AND PELVIS WITHOUT  CONTRAST TECHNIQUE: Multidetector CT imaging of the abdomen and pelvis was performed following the standard protocol without IV contrast. COMPARISON:  Multiple prior CTs of the abdomen and pelvis dating back to 07/04/2013. FINDINGS: Evaluation of this exam is limited in the absence of intravenous contrast. Lower chest: The visualized lung bases are clear. No intra-abdominal free air or free fluid. Hepatobiliary: There is diffuse fatty infiltration of the liver. No intrahepatic biliary ductal dilatation. The gallbladder is mildly distended. No calcified stone. Pancreas: There is coarse calcification of the head and uncinate process of the pancreas dating back to 04/18/2016 sequela of chronic pancreatitis. There is mild thickened appearance of the pancreatic parenchyma with mild surrounding haziness which may represent chronic changes/scarring or acute on chronic pancreatitis. Correlation with pancreatic enzymes recommended. There is no drainable fluid collection, abscess or pseudocyst. There is mild dilatation of the main pancreatic duct as seen on the prior CT, sequela of chronic pancreatitis. Spleen: Normal in size without focal abnormality. Adrenals/Urinary Tract: Adrenal glands are unremarkable. Kidneys are normal, without renal calculi, focal lesion, or hydronephrosis. Bladder is unremarkable. Stomach/Bowel: Stomach is within normal limits. Appendix appears normal. No evidence of bowel wall thickening, distention, or inflammatory changes. Vascular/Lymphatic: The abdominal aorta and IVC are grossly unremarkable on this noncontrast study. No portal venous gas identified. There is no adenopathy. Reproductive: The prostate and seminal vesicles are grossly unremarkable. Other: Small fat containing umbilical hernia. Small pockets of subcutaneous air in the right anterior abdomen likely related to recent subcutaneous injection. Musculoskeletal: No acute or significant osseous findings. IMPRESSION: 1. Coarse  calcification of the uncinate process and head of the pancreas sequela of chronic pancreatitis. There is no mild peripancreatic haziness which may be chronic in nature or represent acute on chronic pancreatitis. Correlation with pancreatic enzymes recommended. There is no drainable fluid collection/abscess or pseudocyst. 2. Fatty liver. 3. No bowel obstruction or active inflammation.  Normal appendix. Electronically Signed   By: Elgie Collard M.D.   On: 02/09/2017 Araceli Bouche, Talar Fraley M.D on 02/11/2017 at 3:42 PM  Between 7am to 7pm - Pager - [phone]  After 7pm go to [email or web address] - password [facility]  [facility] -  Office  [phone]

## 2017-02-12 DIAGNOSIS — N179 Acute kidney failure, unspecified: Secondary | ICD-10-CM

## 2017-02-12 DIAGNOSIS — E860 Dehydration: Secondary | ICD-10-CM

## 2017-02-12 DIAGNOSIS — K852 Alcohol induced acute pancreatitis without necrosis or infection: Principal | ICD-10-CM

## 2017-02-12 DIAGNOSIS — Z720 Tobacco use: Secondary | ICD-10-CM

## 2017-02-12 LAB — BASIC METABOLIC PANEL
ANION GAP: 7 (ref 5–15)
BUN: 5 mg/dL — ABNORMAL LOW (ref 6–20)
CALCIUM: 8.4 mg/dL — AB (ref 8.9–10.3)
CHLORIDE: 110 mmol/L (ref 101–111)
CO2: 22 mmol/L (ref 22–32)
Creatinine, Ser: 1.18 mg/dL (ref 0.61–1.24)
GFR calc Af Amer: >60 mL/min (ref >60)
GFR calc non Af Amer: >60 mL/min (ref >60)
GLUCOSE: 87 mg/dL (ref 65–99)
Potassium: 3.8 mmol/L (ref 3.5–5.1)
Sodium: 139 mmol/L (ref 135–145)

## 2017-02-12 MED ORDER — FOLIC ACID 1 MG PO TABS: 1.0000 mg | ORAL_TABLET | Freq: Every day | ORAL | 0 refills | Status: AC

## 2017-02-12 MED ORDER — HYDRALAZINE HCL 20 MG/ML IJ SOLN
10.0000 mg | Freq: Four times a day (QID) | INTRAMUSCULAR | Status: DC | PRN
Start: 2017-02-12 — End: 2017-02-12
  Administered 2017-02-12: 10 mg via INTRAVENOUS
  Filled 2017-02-12: qty 1

## 2017-02-12 MED ORDER — NICOTINE 21 MG/24HR TD PT24: 21.0000 mg | MEDICATED_PATCH | TRANSDERMAL | 0 refills | Status: DC

## 2017-02-12 MED ORDER — PANTOPRAZOLE SODIUM 40 MG PO TBEC: 40.0000 mg | DELAYED_RELEASE_TABLET | Freq: Every day | ORAL | 0 refills | Status: DC

## 2017-02-12 MED ORDER — THERA VITAL M PO TABS: 1.0000 | ORAL_TABLET | Freq: Every day | ORAL | 0 refills | Status: DC

## 2017-02-12 MED ORDER — THIAMINE HCL 100 MG PO TABS: 100.0000 mg | ORAL_TABLET | Freq: Every day | ORAL | 0 refills | Status: DC

## 2017-02-12 NOTE — Discharge Summary (Signed)
Physician Discharge Summary  Jeff Watts ZOX:[id number] DOB: [birth year] DOA: 02/09/2017  PCP: Patient, No Pcp Per  Admit date: 02/09/2017 Discharge date: 02/12/2017  Time spent: 45 minutes  Recommendations for Outpatient Follow-up:  Patient will be discharged to home.  Patient will need to follow up with primary care provider within one week of discharge, repeat CBC and CMP.  Patient should continue medications as prescribed.  Patient should follow a soft diet. Avoid alcohol, tobacco, illicit drug use  Discharge Diagnoses:  Acute Alcoholic pancreatitis with mildly elevated LFTs Acute kidney injury with metabolic acidosis Polysubstance abuse Tobacco abuse  Normocytic anemia  Discharge Condition: Stable  Diet recommendation: soft  Filed Weights   02/09/17 1050 02/10/17 0500 02/12/17 0556  Weight: 57.8 kg (127 lb 8 oz) 61.3 kg (135 lb 3.2 oz) 62.6 kg (138 lb 1.6 oz)    History of present illness:  On 02/09/2017 by Ms. Jeff Silk, NP Jeff Watts is a 31 y.o. male with medical history significant for tobacco abuse and prior pancreatitis who presented to outside hospital with intractable nausea and vomiting with progressive but severe abdominal pain; clinical findings c/w acute pancreatitis (lipase of 1414). Was also found to have marked elevation in creatinine of 4.09 noting in March of this year creatinine was 1.02. Due to concerns of possible need for nephrology consultation (despite the fact the patient w/ adequate urinary output) request was made to transfer patient to this facility and he was accepted as a medical admission. Patient is having no respiratory symptoms or signs of iron overload.  Hospital Course:  Acute alcoholic pancreatitis with mildly elevated LFTs -Patient presented from an outside facility with significant abdominal pain, nausea, nonbloody emesis, dehydration, lipase of 1414 after ingestion of alcoholic beverages -Lipase trended downward -This has been a  recurrent problem, discussed the importance of alcohol abstinence -Patient was placed on IV fluids and improved. -Diet advanced to soft, patient tolerated well -Patient has needed IV pain medication as well as antiemetics during hospital course -LFTs improved -Repeat CMP in 1 week  Acute kidney injury/metabolic acidosis -Improved, Likely secondary to the above -Creatinine 4.09 on admission, today 1.18 -CO2 22 -Repeat CMP in 1 week  Normocytic anemia -Hemoglobin 11.3, patient did receive IV fluids -No reports of melena or hematochezia or hematemesis -Repeat CBC in 1 week  Polysubstance abuse -Including alcohol, marijuana -Drug screen positive for THC -Discussed the importance of cessation as well as abstinence -Social work consulted for resources  Tobacco abuse -Discussed cessation, continue nicotine patch  Procedures: None  Consultations: None  Discharge Exam: Vitals:   02/12/17 1200 02/12/17 1458  BP: (!) 150/106 (!) 147/87  Pulse: 75 96  Resp:    Temp:     Patient states he was able to tolerate his diet well. Denies any current abdominal pain, nausea or vomiting. Denies headache, dizziness, chest pain or shortness of breath.    General: Well developed, well nourished, NAD, appears stated age  HEENT: NCAT, mucous membranes moist.  Cardiovascular: S1 S2 auscultated, no rubs, murmurs or gallops. Regular rate and rhythm.  Respiratory: Clear to auscultation bilaterally with equal chest rise  Abdomen: Soft, nontender, nondistended, + bowel sounds  Extremities: warm dry without cyanosis clubbing or edema  Neuro: AAOx3, nonfocal  Psych: Appropriate mood and affect  Discharge Instructions Discharge Instructions    Discharge instructions    Complete by:  As directed    Patient will be discharged to home.  Patient will need to follow up with primary care provider  within one week of discharge, repeat CBC and CMP.  Patient should continue medications as  prescribed.  Patient should follow a soft diet. Avoid alcohol, tobacco, illicit drug use     Current Discharge Medication List    START taking these medications   Details  folic acid (FOLVITE) 1 MG tablet Take 1 tablet (1 mg total) by mouth daily. Qty: 30 tablet, Refills: 0    Multiple Vitamins-Minerals (MULTIVITAMIN) tablet Take 1 tablet by mouth daily. Qty: 30 tablet, Refills: 0    nicotine (NICODERM CQ - DOSED IN MG/24 HOURS) 21 mg/24hr patch Place 1 patch (21 mg total) onto the skin daily. Qty: 28 patch, Refills: 0    pantoprazole (PROTONIX) 40 MG tablet Take 1 tablet (40 mg total) by mouth daily. Qty: 30 tablet, Refills: 0    thiamine 100 MG tablet Take 1 tablet (100 mg total) by mouth daily. Qty: 30 tablet, Refills: 0      CONTINUE these medications which have NOT CHANGED   Details  ondansetron (ZOFRAN ODT) 8 MG disintegrating tablet Take 1 tablet (8 mg total) by mouth every 8 (eight) hours as needed for nausea or vomiting. Qty: 20 tablet, Refills: 0       No Known Allergies Follow-up Information    Primary care physician. Schedule an appointment as soon as possible for a visit in 1 week(s).   Why:  Hospital follow up           The results of significant diagnostics from this hospitalization (including imaging, microbiology, ancillary and laboratory) are listed below for reference.    Significant Diagnostic Studies: Ct Abdomen Pelvis Wo Contrast  Result Date: 02/09/2017 CLINICAL DATA:  31 year old male with acute on chronic pancreatitis and epigastric pain. EXAM: CT ABDOMEN AND PELVIS WITHOUT CONTRAST TECHNIQUE: Multidetector CT imaging of the abdomen and pelvis was performed following the standard protocol without IV contrast. COMPARISON:  Multiple prior CTs of the abdomen and pelvis dating back to 07/04/2013. FINDINGS: Evaluation of this exam is limited in the absence of intravenous contrast. Lower chest: The visualized lung bases are clear. No intra-abdominal  free air or free fluid. Hepatobiliary: There is diffuse fatty infiltration of the liver. No intrahepatic biliary ductal dilatation. The gallbladder is mildly distended. No calcified stone. Pancreas: There is coarse calcification of the head and uncinate process of the pancreas dating back to 04/18/2016 sequela of chronic pancreatitis. There is mild thickened appearance of the pancreatic parenchyma with mild surrounding haziness which may represent chronic changes/scarring or acute on chronic pancreatitis. Correlation with pancreatic enzymes recommended. There is no drainable fluid collection, abscess or pseudocyst. There is mild dilatation of the main pancreatic duct as seen on the prior CT, sequela of chronic pancreatitis. Spleen: Normal in size without focal abnormality. Adrenals/Urinary Tract: Adrenal glands are unremarkable. Kidneys are normal, without renal calculi, focal lesion, or hydronephrosis. Bladder is unremarkable. Stomach/Bowel: Stomach is within normal limits. Appendix appears normal. No evidence of bowel wall thickening, distention, or inflammatory changes. Vascular/Lymphatic: The abdominal aorta and IVC are grossly unremarkable on this noncontrast study. No portal venous gas identified. There is no adenopathy. Reproductive: The prostate and seminal vesicles are grossly unremarkable. Other: Small fat containing umbilical hernia. Small pockets of subcutaneous air in the right anterior abdomen likely related to recent subcutaneous injection. Musculoskeletal: No acute or significant osseous findings. IMPRESSION: 1. Coarse calcification of the uncinate process and head of the pancreas sequela of chronic pancreatitis. There is no mild peripancreatic haziness which may be chronic in  nature or represent acute on chronic pancreatitis. Correlation with pancreatic enzymes recommended. There is no drainable fluid collection/abscess or pseudocyst. 2. Fatty liver. 3. No bowel obstruction or active inflammation.   Normal appendix. Electronically Signed   By: Elgie CollardArash  Radparvar M.D.   On: 02/09/2017 20:21    Microbiology: No results found for this or any previous visit (from the past 240 hour(s)).   Labs: Basic Metabolic Panel:  Recent Labs Lab 02/09/17 1126 02/10/17 0528 02/11/17 0631 02/12/17 0638  NA 134* 135 138 139  K 4.8 4.4 3.9 3.8  CL 100* 103 106 110  CO2 22 22 24 22   GLUCOSE 101* 74 96 87  BUN 27* 21* 13 5*  CREATININE 3.16* 1.85* 1.40* 1.18  CALCIUM 8.6* 8.8* 8.8* 8.4*  MG 1.9  --   --   --   PHOS 4.5  --   --   --    Liver Function Tests:  Recent Labs Lab 02/09/17 1126 02/10/17 0528  AST 38 42*  ALT 21 15*  ALKPHOS 112 90  BILITOT 1.0 1.1  PROT 7.2 6.2*  ALBUMIN 4.2 3.6    Recent Labs Lab 02/09/17 1126 02/10/17 0528  LIPASE 513* 122*   No results for input(s): AMMONIA in the last 168 hours. CBC:  Recent Labs Lab 02/09/17 1126 02/10/17 0528 02/11/17 0631  WBC 19.6* 11.4* 8.2  NEUTROABS 16.8*  --   --   HGB 12.9* 11.3* 11.3*  HCT 39.0 35.4* 36.0*  MCV 88.0 91.0 91.6  PLT 244 203 166   Cardiac Enzymes: No results for input(s): CKTOTAL, CKMB, CKMBINDEX, TROPONINI in the last 168 hours. BNP: BNP (last 3 results) No results for input(s): BNP in the last 8760 hours.  ProBNP (last 3 results) No results for input(s): PROBNP in the last 8760 hours.  CBG: No results for input(s): GLUCAP in the last 168 hours.     SignedEdsel Petrin:  Thos Matsumoto  [facility] 02/12/2017, 3:14 PM

## 2017-02-12 NOTE — Discharge Instructions (Signed)
Acute Pancreatitis Acute pancreatitis is a condition in which the pancreas suddenly gets irritated and swollen (has inflammation). The pancreas is a large gland behind the stomach. It makes enzymes that help to digest food. The pancreas also makes hormones that help to control your blood sugar. Acute pancreatitis happens when the enzymes attack the pancreas and damage it. Most attacks last a couple of days and can cause serious problems. Follow these instructions at home: Eating and drinking  Follow instructions from your doctor about diet. You may need to: ? Avoid alcohol. ? Limit how much fat is in your diet.  Eat small meals often. Avoid eating big meals.  Drink enough fluid to keep your pee (urine) clear or pale yellow.  Do not drink alcohol if it caused your condition. General instructions  Take over-the-counter and prescription medicines only as told by your doctor.  Do not use any tobacco products. These include cigarettes, chewing tobacco, and e-cigarettes. If you need help quitting, ask your doctor.  Get plenty of rest.  If directed, check your blood sugar at home as told by your doctor.  Keep all follow-up visits as told by your doctor. This is important. Contact a doctor if:  You do not get better as quickly as expected.  You have new symptoms.  Your symptoms get worse.  You have lasting pain or weakness.  You continue to feel sick to your stomach (nauseous).  You get better and then you have another pain attack.  You have a fever. Get help right away if:  You cannot eat or keep fluids down.  Your pain becomes very bad.  Your skin or the white part of your eyes turns yellow (jaundice).  You throw up (vomit).  You feel dizzy or you pass out (faint).  Your blood sugar is high (over 300 mg/dL). This information is not intended to replace advice given to you by your health care provider. Make sure you discuss any questions you have with your health care  provider. Document Released: 01/23/2008 Document Revised: 01/12/2016 Document Reviewed: 05/10/2015 Elsevier Interactive Patient Education  2018 [employer]  

## 2017-02-12 NOTE — Progress Notes (Signed)
CSW received consult regarding substance use resources. CSW spoke with patient. He reported that he lives with family. When asked about his substance use, patient reported that he already has a rehab program "in the works" and refused resources. Patient denied any other questions.  CSW signing off.  Osborne Cascoadia Chauntay Paszkiewicz LCSWA [phone]

## 2017-02-12 NOTE — Progress Notes (Signed)
Patient discharged to home with his brother around 5:30p. Discharge instructions given to patient who verbalized understanding. Medications will be picked up at local [facility]. IV removed and intact. Skin intact.   Sherlon HandingElisa Darrion Wyszynski, LPN 3/01/606/26/18

## 2020-06-27 ENCOUNTER — Other Ambulatory Visit: Payer: Self-pay

## 2020-06-27 ENCOUNTER — Emergency Department (HOSPITAL_COMMUNITY)
Admission: EM | Admit: 2020-06-27 | Discharge: 2020-06-27 | Disposition: A | Discharge status: Home / Self Care | Payer: Self-pay | Attending: Emergency Medicine | Admitting: Emergency Medicine

## 2020-06-27 ENCOUNTER — Encounter (HOSPITAL_COMMUNITY): Payer: Self-pay | Admitting: Emergency Medicine

## 2020-06-27 DIAGNOSIS — K852 Alcohol induced acute pancreatitis without necrosis or infection: Secondary | ICD-10-CM | POA: Insufficient documentation

## 2020-06-27 DIAGNOSIS — F17210 Nicotine dependence, cigarettes, uncomplicated: Secondary | ICD-10-CM | POA: Insufficient documentation

## 2020-06-27 DIAGNOSIS — Z79899 Other long term (current) drug therapy: Secondary | ICD-10-CM | POA: Insufficient documentation

## 2020-06-27 DIAGNOSIS — J45909 Unspecified asthma, uncomplicated: Secondary | ICD-10-CM | POA: Insufficient documentation

## 2020-06-27 LAB — COMPREHENSIVE METABOLIC PANEL
ALT: 61 U/L — ABNORMAL HIGH (ref 0–44)
AST: 148 U/L — ABNORMAL HIGH (ref 15–41)
Albumin: 5.2 g/dL — ABNORMAL HIGH (ref 3.5–5.0)
Alkaline Phosphatase: 142 U/L — ABNORMAL HIGH (ref 38–126)
BUN: 17 mg/dL (ref 6–20)
CO2: 15 mmol/L — ABNORMAL LOW (ref 22–32)
Calcium: 10 mg/dL (ref 8.9–10.3)
Chloride: 99 mmol/L (ref 98–111)
Creatinine, Ser: 1.43 mg/dL — ABNORMAL HIGH (ref 0.61–1.24)
GFR, Estimated: >60 mL/min (ref >60)
Glucose, Bld: 192 mg/dL — ABNORMAL HIGH (ref 70–99)
Potassium: 5.1 mmol/L (ref 3.5–5.1)
Sodium: 136 mmol/L (ref 135–145)
Total Bilirubin: 2.1 mg/dL — ABNORMAL HIGH (ref 0.3–1.2)
Total Protein: 9.5 g/dL — ABNORMAL HIGH (ref 6.5–8.1)

## 2020-06-27 LAB — CBC
HCT: 48 % (ref 39.0–52.0)
Hemoglobin: 15.8 g/dL (ref 13.0–17.0)
MCH: 29.6 pg (ref 26.0–34.0)
MCHC: 32.9 g/dL (ref 30.0–36.0)
MCV: 90.1 fL (ref 80.0–100.0)
Platelets: 450 10*3/uL — ABNORMAL HIGH (ref 150–400)
RBC: 5.33 MIL/uL (ref 4.22–5.81)
RDW: 12.7 % (ref 11.5–15.5)
WBC: 20.8 10*3/uL — ABNORMAL HIGH (ref 4.0–10.5)
nRBC: 0 % (ref 0.0–0.2)

## 2020-06-27 LAB — LIPASE, BLOOD: Lipase: 57 U/L — ABNORMAL HIGH (ref 11–51)

## 2020-06-27 MED ORDER — ONDANSETRON 4 MG PO TBDP: 4.0000 mg | ORAL_TABLET | Freq: Three times a day (TID) | ORAL | 0 refills | Status: DC | PRN

## 2020-06-27 MED ORDER — HYDROMORPHONE HCL 1 MG/ML IJ SOLN
1.0000 mg | Freq: Once | INTRAMUSCULAR | Status: AC
Start: 2020-06-27 — End: 2020-06-27
  Administered 2020-06-27: 1 mg via INTRAVENOUS
  Filled 2020-06-27: qty 1

## 2020-06-27 MED ORDER — ONDANSETRON HCL 4 MG/2ML IJ SOLN
4.0000 mg | Freq: Once | INTRAMUSCULAR | Status: AC
  Administered 2020-06-27: 4 mg via INTRAVENOUS
  Filled 2020-06-27: qty 2

## 2020-06-27 MED ORDER — SODIUM CHLORIDE 0.9 % IV BOLUS
1000.0000 mL | Freq: Once | INTRAVENOUS | Status: AC
  Administered 2020-06-27: 1000 mL via INTRAVENOUS

## 2020-06-27 MED ORDER — HYDROMORPHONE HCL 1 MG/ML IJ SOLN
1.0000 mg | Freq: Once | INTRAMUSCULAR | Status: AC
  Administered 2020-06-27: 1 mg via INTRAVENOUS
  Filled 2020-06-27: qty 1

## 2020-06-27 MED ORDER — OXYCODONE HCL 5 MG PO TABS
5.0000 mg | ORAL_TABLET | Freq: Three times a day (TID) | ORAL | 0 refills | Status: DC | PRN
Start: 2020-06-27 — End: 2020-11-07

## 2020-06-27 MED ORDER — OXYCODONE HCL 5 MG PO TABS: 5.0000 mg | ORAL_TABLET | Freq: Three times a day (TID) | ORAL | 0 refills | Status: DC | PRN

## 2020-06-27 NOTE — Discharge Instructions (Addendum)
Avoid alcohol.

## 2020-06-27 NOTE — ED Notes (Signed)
Patient is resting comfortably. 

## 2020-06-27 NOTE — ED Triage Notes (Signed)
Patient brought in via EMS. Alert and oriented. Airway patent. Patient c/o abd pain with nausea and vomiting. Patient actively vomiting. Paer patient all symptoms started this morning. Patient has hx of pancreatitis in which he states this feels similar. Patient does report that he has dizziness as well after vomiting multiple times.

## 2020-06-27 NOTE — ED Notes (Signed)
Offered water to patient

## 2020-06-27 NOTE — ED Notes (Signed)
Pt made aware a urine sample is needed at this time. 

## 2020-06-27 NOTE — ED Provider Notes (Signed)
[facility] EMERGENCY DEPARTMENT Provider Note   CSN: [id number] Arrival date & time: 06/27/20  1751     History Chief Complaint  Patient presents with  . Abdominal Pain    Jeff Watts is a 34 y.o. male.  The history is provided by the patient. No language interpreter was used.  Abdominal Pain Pain location:  Generalized Pain quality: aching   Pain radiates to:  Does not radiate Pain severity:  Moderate Onset quality:  Gradual Timing:  Constant Progression:  Worsening Relieved by:  Nothing Worsened by:  Nothing Ineffective treatments:  None tried Associated symptoms: vomiting    Pt has frequesnt episodes of pancreatitis second to alcohol.  Pt admits to recent drinking    Past Medical History:  Diagnosis Date  . Chronic abdominal pain   . Medical history non-contributory   . Pancreatitis     Patient Active Problem List   Diagnosis Date Noted  . Acute alcoholic pancreatitis 02/09/2017  . Acute kidney injury (HCC) 02/09/2017  . Dehydration, moderate 02/09/2017  . Tobacco abuse 02/09/2017  . Asthma exacerbation 05/16/2015  . Hypokalemia 05/16/2015  . CAP (community acquired pneumonia) 05/16/2015  . Acute pancreatitis 07/19/2013  . Elevated blood pressure 07/19/2013  . Hyperglycemia 07/19/2013  . Dehydration 07/19/2013  . Alcohol abuse 07/19/2013    Past Surgical History:  Procedure Laterality Date  . NO PAST SURGERIES         Family History  Problem Relation Age of Onset  . Hypertension Father     Social History   Tobacco Use  . Smoking status: Current Every Day Smoker    Packs/day: 0.50    Types: Cigarettes  . Smokeless tobacco: Never Used  Vaping Use  . Vaping Use: Never used  Substance Use Topics  . Alcohol use: Yes  . Drug use: Yes    Types: Marijuana    Home Medications Prior to Admission medications   Medication Sig Start Date End Date Taking? Authorizing Provider  Multiple Vitamins-Minerals (MULTIVITAMIN) tablet Take 1 tablet by  mouth daily. 02/12/17   Mikhail, Nita Sells, DO  nicotine (NICODERM CQ - DOSED IN MG/24 HOURS) 21 mg/24hr patch Place 1 patch (21 mg total) onto the skin daily. 02/12/17   Mikhail, Nita Sells, DO  ondansetron (ZOFRAN ODT) 4 MG disintegrating tablet Take 1 tablet (4 mg total) by mouth every 8 (eight) hours as needed for nausea or vomiting. 06/27/20   Elson Areas, PA-C  oxyCODONE (ROXICODONE) 5 MG immediate release tablet Take 1 tablet (5 mg total) by mouth every 8 (eight) hours as needed. 06/27/20 06/27/21  Elson Areas, PA-C  pantoprazole (PROTONIX) 40 MG tablet Take 1 tablet (40 mg total) by mouth daily. 02/12/17 02/12/18  Edsel Petrin, DO  thiamine 100 MG tablet Take 1 tablet (100 mg total) by mouth daily. 02/13/17   Mikhail, Nita Sells, DO  albuterol (PROVENTIL HFA;VENTOLIN HFA) 108 (90 BASE) MCG/ACT inhaler Inhale 2 puffs into the lungs every 6 (six) hours as needed for wheezing or shortness of breath. 05/16/15 05/20/15  Erick Blinks, MD    Allergies    Patient has no known allergies.  Review of Systems   Review of Systems  Gastrointestinal: Positive for abdominal pain and vomiting.  All other systems reviewed and are negative.   Physical Exam Updated Vital Signs BP (!) 165/100   Pulse 74   Temp 97.9 F (36.6 C) (Oral)   Resp 18   Ht 5\' 3"  (1.6 m)   Wt 65.8 kg   SpO2  98%   BMI 25.69 kg/m   Physical Exam Vitals and nursing note reviewed.  Constitutional:      Appearance: He is well-developed.  HENT:     Head: Normocephalic and atraumatic.  Eyes:     Conjunctiva/sclera: Conjunctivae normal.  Cardiovascular:     Rate and Rhythm: Normal rate and regular rhythm.     Heart sounds: No murmur heard.   Pulmonary:     Effort: Pulmonary effort is normal. No respiratory distress.     Breath sounds: Normal breath sounds.  Abdominal:     General: Abdomen is flat. Bowel sounds are normal.     Palpations: Abdomen is soft.  Musculoskeletal:     Cervical back: Neck supple.  Skin:     General: Skin is warm and dry.  Neurological:     General: No focal deficit present.     Mental Status: He is alert.  Psychiatric:        Mood and Affect: Mood normal.     ED Results / Procedures / Treatments   Labs (all labs ordered are listed, but only abnormal results are displayed) Labs Reviewed  LIPASE, BLOOD - Abnormal; Notable for the following components:      Result Value   Lipase 57 (*)    All other components within normal limits  COMPREHENSIVE METABOLIC PANEL - Abnormal; Notable for the following components:   CO2 15 (*)    Glucose, Bld 192 (*)    Creatinine, Ser 1.43 (*)    Total Protein 9.5 (*)    Albumin 5.2 (*)    AST 148 (*)    ALT 61 (*)    Alkaline Phosphatase 142 (*)    Total Bilirubin 2.1 (*)    All other components within normal limits  CBC - Abnormal; Notable for the following components:   WBC 20.8 (*)    Platelets 450 (*)    All other components within normal limits  URINALYSIS, ROUTINE W REFLEX MICROSCOPIC    EKG None  Radiology No results found.  Procedures Procedures (including critical care time)  Medications Ordered in ED Medications  HYDROmorphone (DILAUDID) injection 1 mg (1 mg Intravenous Given 06/27/20 1213)  ondansetron (ZOFRAN) injection 4 mg (4 mg Intravenous Given 06/27/20 1213)  sodium chloride 0.9 % bolus 1,000 mL (0 mLs Intravenous Stopped 06/27/20 1316)  sodium chloride 0.9 % bolus 1,000 mL (0 mLs Intravenous Stopped 06/27/20 1420)  HYDROmorphone (DILAUDID) injection 1 mg (1 mg Intravenous Given 06/27/20 1614)  ondansetron (ZOFRAN) injection 4 mg (4 mg Intravenous Given 06/27/20 1614)    ED Course  I have reviewed the triage vital signs and the nursing notes.  Pertinent labs & imaging results that were available during my care of the patient were reviewed by me and considered in my medical decision making (see chart for details).    MDM Rules/Calculators/A&P                          MDM:  Pt given Iv fluids x 2 liter  and dilaudid and zofran.  Pt able to tolerate oral fluids.  Pt advised to avoid alcohol  Final Clinical Impression(s) / ED Diagnoses Final diagnoses:  Alcohol-induced acute pancreatitis, unspecified complication status    Rx / DC Orders ED Discharge Orders         Ordered    ondansetron (ZOFRAN ODT) 4 MG disintegrating tablet  Every 8 hours PRN  06/27/20 1746    oxyCODONE (ROXICODONE) 5 MG immediate release tablet  Every 8 hours PRN        06/27/20 1746        An After Visit Summary was printed and given to the patient.    Elson Areas, PA-C 06/27/20 1758    Sabas Sous, MD 06/28/20 [phone]

## 2020-11-06 ENCOUNTER — Emergency Department (HOSPITAL_COMMUNITY): Payer: Self-pay

## 2020-11-06 ENCOUNTER — Other Ambulatory Visit: Payer: Self-pay

## 2020-11-06 ENCOUNTER — Encounter (HOSPITAL_COMMUNITY): Payer: Self-pay | Admitting: *Deleted

## 2020-11-06 ENCOUNTER — Observation Stay (HOSPITAL_COMMUNITY)
Admission: EM | Admit: 2020-11-06 | Discharge: 2020-11-07 | Disposition: A | Discharge status: Home / Self Care | Payer: Self-pay | Attending: Internal Medicine | Admitting: Internal Medicine

## 2020-11-06 DIAGNOSIS — Z720 Tobacco use: Secondary | ICD-10-CM | POA: Diagnosis present

## 2020-11-06 DIAGNOSIS — Z20822 Contact with and (suspected) exposure to covid-19: Secondary | ICD-10-CM | POA: Insufficient documentation

## 2020-11-06 DIAGNOSIS — K859 Acute pancreatitis without necrosis or infection, unspecified: Secondary | ICD-10-CM | POA: Diagnosis present

## 2020-11-06 DIAGNOSIS — F10231 Alcohol dependence with withdrawal delirium: Principal | ICD-10-CM | POA: Insufficient documentation

## 2020-11-06 DIAGNOSIS — Z8719 Personal history of other diseases of the digestive system: Secondary | ICD-10-CM | POA: Insufficient documentation

## 2020-11-06 DIAGNOSIS — F1020 Alcohol dependence, uncomplicated: Secondary | ICD-10-CM

## 2020-11-06 DIAGNOSIS — E872 Acidosis: Secondary | ICD-10-CM | POA: Insufficient documentation

## 2020-11-06 DIAGNOSIS — F17210 Nicotine dependence, cigarettes, uncomplicated: Secondary | ICD-10-CM | POA: Insufficient documentation

## 2020-11-06 DIAGNOSIS — K709 Alcoholic liver disease, unspecified: Secondary | ICD-10-CM

## 2020-11-06 DIAGNOSIS — F10239 Alcohol dependence with withdrawal, unspecified: Secondary | ICD-10-CM

## 2020-11-06 DIAGNOSIS — F10232 Alcohol dependence with withdrawal with perceptual disturbance: Secondary | ICD-10-CM

## 2020-11-06 DIAGNOSIS — E860 Dehydration: Secondary | ICD-10-CM | POA: Diagnosis present

## 2020-11-06 DIAGNOSIS — N179 Acute kidney failure, unspecified: Secondary | ICD-10-CM | POA: Insufficient documentation

## 2020-11-06 DIAGNOSIS — R739 Hyperglycemia, unspecified: Secondary | ICD-10-CM | POA: Insufficient documentation

## 2020-11-06 DIAGNOSIS — E871 Hypo-osmolality and hyponatremia: Secondary | ICD-10-CM | POA: Insufficient documentation

## 2020-11-06 DIAGNOSIS — K852 Alcohol induced acute pancreatitis without necrosis or infection: Secondary | ICD-10-CM | POA: Insufficient documentation

## 2020-11-06 DIAGNOSIS — F1010 Alcohol abuse, uncomplicated: Secondary | ICD-10-CM | POA: Diagnosis present

## 2020-11-06 DIAGNOSIS — F10988 Alcohol use, unspecified with other alcohol-induced disorder: Secondary | ICD-10-CM

## 2020-11-06 DIAGNOSIS — D6959 Other secondary thrombocytopenia: Secondary | ICD-10-CM

## 2020-11-06 DIAGNOSIS — F10939 Alcohol use, unspecified with withdrawal, unspecified: Secondary | ICD-10-CM

## 2020-11-06 DIAGNOSIS — F10930 Alcohol use, unspecified with withdrawal, uncomplicated: Secondary | ICD-10-CM

## 2020-11-06 DIAGNOSIS — F10931 Alcohol use, unspecified with withdrawal delirium: Secondary | ICD-10-CM | POA: Diagnosis present

## 2020-11-06 DIAGNOSIS — F10230 Alcohol dependence with withdrawal, uncomplicated: Secondary | ICD-10-CM

## 2020-11-06 DIAGNOSIS — K7010 Alcoholic hepatitis without ascites: Secondary | ICD-10-CM | POA: Insufficient documentation

## 2020-11-06 DIAGNOSIS — R569 Unspecified convulsions: Secondary | ICD-10-CM

## 2020-11-06 DIAGNOSIS — I10 Essential (primary) hypertension: Secondary | ICD-10-CM | POA: Insufficient documentation

## 2020-11-06 DIAGNOSIS — E876 Hypokalemia: Secondary | ICD-10-CM | POA: Insufficient documentation

## 2020-11-06 DIAGNOSIS — Z79899 Other long term (current) drug therapy: Secondary | ICD-10-CM | POA: Insufficient documentation

## 2020-11-06 LAB — LIPASE, BLOOD: Lipase: 127 U/L — ABNORMAL HIGH (ref 11–51)

## 2020-11-06 LAB — COMPREHENSIVE METABOLIC PANEL
ALT: 57 U/L — ABNORMAL HIGH (ref 0–44)
AST: 132 U/L — ABNORMAL HIGH (ref 15–41)
Albumin: 3.5 g/dL (ref 3.5–5.0)
Alkaline Phosphatase: 145 U/L — ABNORMAL HIGH (ref 38–126)
Anion gap: 13 (ref 5–15)
BUN: 11 mg/dL (ref 6–20)
CO2: 19 mmol/L — ABNORMAL LOW (ref 22–32)
Calcium: 8.8 mg/dL — ABNORMAL LOW (ref 8.9–10.3)
Chloride: 105 mmol/L (ref 98–111)
Creatinine, Ser: 0.97 mg/dL (ref 0.61–1.24)
GFR, Estimated: >60 mL/min (ref >60)
Glucose, Bld: 112 mg/dL — ABNORMAL HIGH (ref 70–99)
Potassium: 3.6 mmol/L (ref 3.5–5.1)
Sodium: 137 mmol/L (ref 135–145)
Total Bilirubin: 0.9 mg/dL (ref 0.3–1.2)
Total Protein: 6.8 g/dL (ref 6.5–8.1)

## 2020-11-06 LAB — CBC
HCT: 38.4 % — ABNORMAL LOW (ref 39.0–52.0)
Hemoglobin: 12.5 g/dL — ABNORMAL LOW (ref 13.0–17.0)
MCH: 30.6 pg (ref 26.0–34.0)
MCHC: 32.6 g/dL (ref 30.0–36.0)
MCV: 94.1 fL (ref 80.0–100.0)
Platelets: 115 10*3/uL — ABNORMAL LOW (ref 150–400)
RBC: 4.08 MIL/uL — ABNORMAL LOW (ref 4.22–5.81)
RDW: 12.7 % (ref 11.5–15.5)
WBC: 4.2 10*3/uL (ref 4.0–10.5)
nRBC: 0 % (ref 0.0–0.2)

## 2020-11-06 LAB — BETA-HYDROXYBUTYRIC ACID: Beta-Hydroxybutyric Acid: 0.52 mmol/L — ABNORMAL HIGH (ref 0.05–0.27)

## 2020-11-06 LAB — TROPONIN I (HIGH SENSITIVITY)
Troponin I (High Sensitivity): 5 ng/L (ref <18)
Troponin I (High Sensitivity): 6 ng/L (ref <18)
Troponin I (High Sensitivity): 7 ng/L (ref <18)

## 2020-11-06 LAB — BASIC METABOLIC PANEL
Anion gap: 18 — ABNORMAL HIGH (ref 5–15)
BUN: 11 mg/dL (ref 6–20)
CO2: 15 mmol/L — ABNORMAL LOW (ref 22–32)
Calcium: 9.2 mg/dL (ref 8.9–10.3)
Chloride: 97 mmol/L — ABNORMAL LOW (ref 98–111)
Creatinine, Ser: 1.31 mg/dL — ABNORMAL HIGH (ref 0.61–1.24)
GFR, Estimated: >60 mL/min (ref >60)
Glucose, Bld: 257 mg/dL — ABNORMAL HIGH (ref 70–99)
Potassium: 3.8 mmol/L (ref 3.5–5.1)
Sodium: 130 mmol/L — ABNORMAL LOW (ref 135–145)

## 2020-11-06 LAB — PHOSPHORUS: Phosphorus: 1.3 mg/dL — ABNORMAL LOW (ref 2.5–4.6)

## 2020-11-06 LAB — HEPATIC FUNCTION PANEL
ALT: 71 U/L — ABNORMAL HIGH (ref 0–44)
AST: 181 U/L — ABNORMAL HIGH (ref 15–41)
Albumin: 4.2 g/dL (ref 3.5–5.0)
Alkaline Phosphatase: 177 U/L — ABNORMAL HIGH (ref 38–126)
Bilirubin, Direct: 0.2 mg/dL (ref 0.0–0.2)
Indirect Bilirubin: 1 mg/dL — ABNORMAL HIGH (ref 0.3–0.9)
Total Bilirubin: 1.2 mg/dL (ref 0.3–1.2)
Total Protein: 8.2 g/dL — ABNORMAL HIGH (ref 6.5–8.1)

## 2020-11-06 LAB — RAPID URINE DRUG SCREEN, HOSP PERFORMED
Amphetamines: NOT DETECTED
Barbiturates: NOT DETECTED
Benzodiazepines: NOT DETECTED
Cocaine: NOT DETECTED
Opiates: NOT DETECTED
Tetrahydrocannabinol: POSITIVE — AB

## 2020-11-06 LAB — HEMOGLOBIN A1C
Hgb A1c MFr Bld: 5.1 % (ref 4.8–5.6)
Mean Plasma Glucose: 99.67 mg/dL

## 2020-11-06 LAB — GLUCOSE, CAPILLARY
Glucose-Capillary: 61 mg/dL — ABNORMAL LOW (ref 70–99)
Glucose-Capillary: 117 mg/dL — ABNORMAL HIGH (ref 70–99)

## 2020-11-06 LAB — MAGNESIUM: Magnesium: 2.2 mg/dL (ref 1.7–2.4)

## 2020-11-06 LAB — ETHANOL: Alcohol, Ethyl (B): <10 mg/dL (ref <10)

## 2020-11-06 LAB — HIV ANTIBODY (ROUTINE TESTING W REFLEX): HIV Screen 4th Generation wRfx: NONREACTIVE

## 2020-11-06 MED ORDER — TRAZODONE HCL 50 MG PO TABS: 100.0000 mg | ORAL_TABLET | Freq: Every evening | ORAL | Status: DC | PRN

## 2020-11-06 MED ORDER — ACETAMINOPHEN 325 MG PO TABS: 650.0000 mg | ORAL_TABLET | Freq: Four times a day (QID) | ORAL | Status: DC | PRN

## 2020-11-06 MED ORDER — BISACODYL 10 MG RE SUPP: 10.0000 mg | Freq: Every day | RECTAL | Status: DC | PRN

## 2020-11-06 MED ORDER — LABETALOL HCL 5 MG/ML IV SOLN
10.0000 mg | INTRAVENOUS | Status: DC | PRN
  Administered 2020-11-06 – 2020-11-07 (×3): 10 mg via INTRAVENOUS
  Filled 2020-11-06 (×3): qty 4

## 2020-11-06 MED ORDER — SODIUM CHLORIDE 0.9 % IV BOLUS
1000.0000 mL | Freq: Once | INTRAVENOUS | Status: AC
  Administered 2020-11-06: 1000 mL via INTRAVENOUS

## 2020-11-06 MED ORDER — THIAMINE HCL 100 MG/ML IJ SOLN: 100.0000 mg | Freq: Every day | INTRAMUSCULAR | Status: DC

## 2020-11-06 MED ORDER — HEPARIN SODIUM (PORCINE) 5000 UNIT/ML IJ SOLN
5000.0000 [IU] | Freq: Three times a day (TID) | INTRAMUSCULAR | Status: DC
  Administered 2020-11-06 – 2020-11-07 (×4): 5000 [IU] via SUBCUTANEOUS
  Filled 2020-11-06 (×4): qty 1

## 2020-11-06 MED ORDER — LORAZEPAM 2 MG/ML IJ SOLN: 1.0000 mg | INTRAMUSCULAR | Status: DC | PRN

## 2020-11-06 MED ORDER — SODIUM CHLORIDE 0.9% FLUSH
3.0000 mL | Freq: Two times a day (BID) | INTRAVENOUS | Status: DC
  Administered 2020-11-06 – 2020-11-07 (×2): 3 mL via INTRAVENOUS

## 2020-11-06 MED ORDER — LORAZEPAM 2 MG/ML IJ SOLN
1.0000 mg | Freq: Once | INTRAMUSCULAR | Status: AC
  Administered 2020-11-06: 1 mg via INTRAVENOUS
  Filled 2020-11-06: qty 1

## 2020-11-06 MED ORDER — LORAZEPAM 1 MG PO TABS: 1.0000 mg | ORAL_TABLET | ORAL | Status: DC | PRN

## 2020-11-06 MED ORDER — CHLORHEXIDINE GLUCONATE CLOTH 2 % EX PADS
6.0000 | MEDICATED_PAD | Freq: Every day | CUTANEOUS | Status: DC
  Administered 2020-11-06 – 2020-11-07 (×2): 6 via TOPICAL

## 2020-11-06 MED ORDER — THIAMINE HCL 100 MG/ML IJ SOLN
Freq: Once | INTRAVENOUS | Status: AC
  Filled 2020-11-06: qty 1000

## 2020-11-06 MED ORDER — ACETAMINOPHEN 650 MG RE SUPP: 650.0000 mg | Freq: Four times a day (QID) | RECTAL | Status: DC | PRN

## 2020-11-06 MED ORDER — MAGNESIUM SULFATE 2 GM/50ML IV SOLN
2.0000 g | Freq: Once | INTRAVENOUS | Status: AC
  Administered 2020-11-06: 2 g via INTRAVENOUS
  Filled 2020-11-06: qty 50

## 2020-11-06 MED ORDER — ADULT MULTIVITAMIN W/MINERALS CH: 1.0000 | ORAL_TABLET | Freq: Every day | ORAL | Status: DC

## 2020-11-06 MED ORDER — ADULT MULTIVITAMIN W/MINERALS CH
1.0000 | ORAL_TABLET | Freq: Every day | ORAL | Status: DC
  Administered 2020-11-06 – 2020-11-07 (×2): 1 via ORAL
  Filled 2020-11-06 (×2): qty 1

## 2020-11-06 MED ORDER — INSULIN ASPART 100 UNIT/ML ~~LOC~~ SOLN: 0.0000 [IU] | Freq: Three times a day (TID) | SUBCUTANEOUS | Status: DC

## 2020-11-06 MED ORDER — SODIUM CHLORIDE 0.9% FLUSH: 3.0000 mL | INTRAVENOUS | Status: DC | PRN

## 2020-11-06 MED ORDER — SODIUM CHLORIDE 0.9 % IV SOLN: 250.0000 mL | INTRAVENOUS | Status: DC | PRN

## 2020-11-06 MED ORDER — FOLIC ACID 1 MG PO TABS: 1.0000 mg | ORAL_TABLET | Freq: Every day | ORAL | Status: DC

## 2020-11-06 MED ORDER — PANTOPRAZOLE SODIUM 40 MG IV SOLR
40.0000 mg | INTRAVENOUS | Status: DC
  Administered 2020-11-06 – 2020-11-07 (×2): 40 mg via INTRAVENOUS
  Filled 2020-11-06 (×2): qty 40

## 2020-11-06 MED ORDER — THIAMINE HCL 100 MG PO TABS: 100.0000 mg | ORAL_TABLET | Freq: Every day | ORAL | Status: DC

## 2020-11-06 MED ORDER — DEXMEDETOMIDINE HCL IN NACL 200 MCG/50ML IV SOLN
0.4000 ug/kg/h | INTRAVENOUS | Status: DC
  Filled 2020-11-06: qty 50

## 2020-11-06 MED ORDER — INSULIN ASPART 100 UNIT/ML ~~LOC~~ SOLN: 0.0000 [IU] | Freq: Every day | SUBCUTANEOUS | Status: DC

## 2020-11-06 MED ORDER — ALBUTEROL SULFATE (2.5 MG/3ML) 0.083% IN NEBU: 2.5000 mg | INHALATION_SOLUTION | RESPIRATORY_TRACT | Status: DC | PRN

## 2020-11-06 MED ORDER — POLYETHYLENE GLYCOL 3350 17 G PO PACK
17.0000 g | PACK | Freq: Every day | ORAL | Status: DC | PRN
Start: 2020-11-06 — End: 2020-11-07

## 2020-11-06 MED ORDER — NICOTINE 14 MG/24HR TD PT24
14.0000 mg | MEDICATED_PATCH | Freq: Every day | TRANSDERMAL | Status: DC
  Administered 2020-11-06 – 2020-11-07 (×2): 14 mg via TRANSDERMAL
  Filled 2020-11-06 (×2): qty 1

## 2020-11-06 MED ORDER — THIAMINE HCL 100 MG PO TABS
100.0000 mg | ORAL_TABLET | Freq: Every day | ORAL | Status: DC
  Administered 2020-11-07: 100 mg via ORAL
  Filled 2020-11-06 (×2): qty 1

## 2020-11-06 MED ORDER — SODIUM CHLORIDE 0.9 % IV SOLN: INTRAVENOUS | Status: DC

## 2020-11-06 MED ORDER — FOLIC ACID 1 MG PO TABS
1.0000 mg | ORAL_TABLET | Freq: Every day | ORAL | Status: DC
  Administered 2020-11-06 – 2020-11-07 (×2): 1 mg via ORAL
  Filled 2020-11-06 (×2): qty 1

## 2020-11-06 MED ORDER — ONDANSETRON HCL 4 MG/2ML IJ SOLN: 4.0000 mg | Freq: Four times a day (QID) | INTRAMUSCULAR | Status: DC | PRN

## 2020-11-06 NOTE — ED Provider Notes (Signed)
[facility] EMERGENCY DEPARTMENT Provider Note   CSN: [id number] Arrival date & time: 11/06/20  1158     History Chief Complaint  Patient presents with  . Chest Pain    Jeff Watts is a 35 y.o. male.  Patient c/o epigastric and lower midline chest pain onset in past day. Symptoms acute onset, moderate, constant, persistent, dull, non radiating. No associated sob. No nv or diaphoresis. EMS reports parent heart him 'bumping around' earlier, no definite seizure noted. Pt with hx etoh abuse, denies recent. Hx pancreatitis - unsure of similar. Denies hx cad or fam hx premature cad. No cough or uri symptoms. No fever or chills. Denies exertional cp or discomfort. No pleuritic pain. No leg pain or swelling. No abd distension. No diarrhea or constipation. No gu c/o. Pt is a relatively poor historian.   The history is provided by the patient and the EMS personnel. The history is limited by the condition of the patient.  Chest Pain Associated symptoms: no back pain, no cough, no fever, no headache, no numbness, no palpitations, no shortness of breath, no vomiting and no weakness        Past Medical History:  Diagnosis Date  . Chronic abdominal pain   . Medical history non-contributory   . Pancreatitis     Patient Active Problem List   Diagnosis Date Noted  . Acute alcoholic pancreatitis 02/09/2017  . Acute kidney injury (HCC) 02/09/2017  . Dehydration, moderate 02/09/2017  . Tobacco abuse 02/09/2017  . Asthma exacerbation 05/16/2015  . Hypokalemia 05/16/2015  . CAP (community acquired pneumonia) 05/16/2015  . Acute pancreatitis 07/19/2013  . Elevated blood pressure 07/19/2013  . Hyperglycemia 07/19/2013  . Dehydration 07/19/2013  . Alcohol abuse 07/19/2013    Past Surgical History:  Procedure Laterality Date  . NO PAST SURGERIES         Family History  Problem Relation Age of Onset  . Hypertension Father     Social History   Tobacco Use  . Smoking status: Current  Every Day Smoker    Packs/day: 0.50    Types: Cigarettes  . Smokeless tobacco: Never Used  Vaping Use  . Vaping Use: Never used  Substance Use Topics  . Alcohol use: Yes    Comment: intermittent last [place] reported on 11/06/20 was 11/03/20  . Drug use: Yes    Types: Marijuana    Home Medications Prior to Admission medications   Medication Sig Start Date End Date Taking? Authorizing Provider  cloNIDine (CATAPRES) 0.1 MG tablet Take 1 tablet by mouth daily. 04/14/16  Yes [provider]  diphenhydrAMINE (BENADRYL) 25 MG tablet Take 25 mg by mouth daily as needed for allergies.   Yes [provider]  ondansetron (ZOFRAN ODT) 4 MG disintegrating tablet Take 1 tablet (4 mg total) by mouth every 8 (eight) hours as needed for nausea or vomiting. Patient not taking: Reported on 11/06/2020 06/27/20   Elson Areas, PA-C  oxyCODONE (ROXICODONE) 5 MG immediate release tablet Take 1 tablet (5 mg total) by mouth every 8 (eight) hours as needed. Patient not taking: Reported on 11/06/2020 06/27/20 06/27/21  Elson Areas, PA-C  albuterol (PROVENTIL HFA;VENTOLIN HFA) 108 (90 BASE) MCG/ACT inhaler Inhale 2 puffs into the lungs every 6 (six) hours as needed for wheezing or shortness of breath. 05/16/15 05/20/15  Erick Blinks, MD    Allergies    Patient has no known allergies.  Review of Systems   Review of Systems  Constitutional: Negative for fever.  HENT: Negative for sore throat.   Eyes: Negative for pain and visual disturbance.  Respiratory: Negative for cough and shortness of breath.   Cardiovascular: Positive for chest pain. Negative for palpitations and leg swelling.  Gastrointestinal: Negative for diarrhea and vomiting.  Genitourinary: Negative for dysuria and flank pain.  Musculoskeletal: Negative for back pain and neck pain.  Skin: Negative for rash.  Neurological: Negative for weakness, numbness and headaches.  Hematological: Does not bruise/bleed easily.   Psychiatric/Behavioral: The patient is nervous/anxious.     Physical Exam Updated Vital Signs Pulse (!) 110   Temp 98.7 F (37.1 C) (Oral)   Resp 14   Ht 1.6 m (5\' 3" )   Wt 70 kg   SpO2 100%   BMI 27.34 kg/m   Physical Exam Vitals and nursing note reviewed.  Constitutional:      Appearance: Normal appearance. He is well-developed.  HENT:     Head: Atraumatic.     Nose: Nose normal.     Mouth/Throat:     Mouth: Mucous membranes are moist.     Pharynx: Oropharynx is clear.  Eyes:     General: No scleral icterus.    Conjunctiva/sclera: Conjunctivae normal.     Pupils: Pupils are equal, round, and reactive to light.  Neck:     Vascular: No carotid bruit.     Trachea: No tracheal deviation.     Comments: No stiffness or rigidity.  Cardiovascular:     Rate and Rhythm: Normal rate and regular rhythm.     Pulses: Normal pulses.     Heart sounds: Normal heart sounds. No murmur heard. No friction rub. No gallop.   Pulmonary:     Effort: Pulmonary effort is normal. No accessory muscle usage or respiratory distress.     Breath sounds: Normal breath sounds.     Comments: Mild chest wall tenderness. Normal chest movement. No crepitus.  Abdominal:     General: Bowel sounds are normal. There is no distension.     Palpations: Abdomen is soft. There is no mass.     Tenderness: There is no abdominal tenderness. There is no guarding or rebound.     Hernia: No hernia is present.  Genitourinary:    Comments: No cva tenderness. Musculoskeletal:        General: No swelling or tenderness.     Cervical back: Normal range of motion and neck supple. No rigidity.     Right lower leg: No edema.     Left lower leg: No edema.  Skin:    General: Skin is warm and dry.     Findings: No rash.  Neurological:     Mental Status: He is alert.     Comments: Alert, speech clear. No dysarthria or aphasia. Motor/sens grossly intact bil.   Psychiatric:     Comments: Anxious.      ED Results /  Procedures / Treatments   Labs (all labs ordered are listed, but only abnormal results are displayed) Results for orders placed or performed during the hospital encounter of 11/06/20  Basic metabolic panel  Result Value Ref Range   Sodium 130 (L) 135 - 145 mmol/L   Potassium 3.8 3.5 - 5.1 mmol/L   Chloride 97 (L) 98 - 111 mmol/L   CO2 15 (L) 22 - 32 mmol/L   Glucose, Bld 257 (H) 70 - 99 mg/dL   BUN 11 6 - 20 mg/dL   Creatinine, Ser 11/08/20 (H) 0.61 - 1.24 mg/dL   Calcium  9.2 8.9 - 10.3 mg/dL   GFR, Estimated >95 >09 mL/min   Anion gap 18 (H) 5 - 15  CBC  Result Value Ref Range   WBC 4.2 4.0 - 10.5 K/uL   RBC 4.08 (L) 4.22 - 5.81 MIL/uL   Hemoglobin 12.5 (L) 13.0 - 17.0 g/dL   HCT 32.6 (L) 71.2 - 45.8 %   MCV 94.1 80.0 - 100.0 fL   MCH 30.6 26.0 - 34.0 pg   MCHC 32.6 30.0 - 36.0 g/dL   RDW 09.9 83.3 - 82.5 %   Platelets 115 (L) 150 - 400 K/uL   nRBC 0.0 0.0 - 0.2 %  Ethanol  Result Value Ref Range   Alcohol, Ethyl (B) <10 <10 mg/dL  Rapid urine drug screen (hospital performed)  Result Value Ref Range   Opiates NONE DETECTED NONE DETECTED   Cocaine NONE DETECTED NONE DETECTED   Benzodiazepines NONE DETECTED NONE DETECTED   Amphetamines NONE DETECTED NONE DETECTED   Tetrahydrocannabinol POSITIVE (A) NONE DETECTED   Barbiturates NONE DETECTED NONE DETECTED  Hepatic function panel  Result Value Ref Range   Total Protein 8.2 (H) 6.5 - 8.1 g/dL   Albumin 4.2 3.5 - 5.0 g/dL   AST 053 (H) 15 - 41 U/L   ALT 71 (H) 0 - 44 U/L   Alkaline Phosphatase 177 (H) 38 - 126 U/L   Total Bilirubin 1.2 0.3 - 1.2 mg/dL   Bilirubin, Direct 0.2 0.0 - 0.2 mg/dL   Indirect Bilirubin 1.0 (H) 0.3 - 0.9 mg/dL  Lipase, blood  Result Value Ref Range   Lipase 127 (H) 11 - 51 U/L  Beta-hydroxybutyric acid  Result Value Ref Range   Beta-Hydroxybutyric Acid 0.52 (H) 0.05 - 0.27 mmol/L  Troponin I (High Sensitivity)  Result Value Ref Range   Troponin I (High Sensitivity) 5 <18 ng/L  Troponin I (High  Sensitivity)  Result Value Ref Range   Troponin I (High Sensitivity) 6 <18 ng/L  Troponin I (High Sensitivity)  Result Value Ref Range   Troponin I (High Sensitivity) 7 <18 ng/L   DG Chest 2 View  Result Date: 11/06/2020 CLINICAL DATA:  Chest pain and shortness of breath. EXAM: CHEST - 2 VIEW COMPARISON:  October 05, 2019 FINDINGS: The heart size and mediastinal contours are within normal limits. Both lungs are clear. The visualized skeletal structures are unremarkable. IMPRESSION: No active cardiopulmonary disease. Electronically Signed   By: Sherian Rein M.D.   On: 11/06/2020 12:59    EKG EKG Interpretation  Date/Time:  Sunday November 06 2020 12:05:11 EDT Ventricular Rate:  131 PR Interval:    QRS Duration: 86 QT Interval:  299 QTC Calculation: 442 R Axis:   87 Text Interpretation: Sinus tachycardia Non-specific ST-t changes Confirmed by Cathren Laine ([id number]) on 11/06/2020 12:38:06 PM   Radiology No results found.  Procedures Procedures 35  Medications Ordered in ED Medications  sodium chloride 0.9 % bolus 1,000 mL (1,000 mLs Intravenous New Bag/Given 11/06/20 1251)  LORazepam (ATIVAN) injection 1 mg (1 mg Intravenous Given 11/06/20 1251)    ED Course  I have reviewed the triage vital signs and the nursing notes.  Pertinent labs & imaging results that were available during my care of the patient were reviewed by me and considered in my medical decision making (see chart for details).    MDM Rules/Calculators/A&P  Iv ns bolus. Ativan 1 mg iv. Continuous pulse ox and cardiac monitoring. Stat labs. Cxr.   Reviewed nursing notes and prior charts for additional history.   Mom arrives, additional hx - describes while sitting in room, generalized tonic clonic seizure lasting few minutes, no hx same, no incontinence, briefly post-ictal, but now mental status c/w baseline.   Labs reviewed/interpreted by me - AST >ALT c/w etoh abuse/etoh liver disease.  Platelet count also low, also c/w chronic etoh abuse. Pt tends to minimize etoh use, however mom indicates is heavy, frequent drinker, that when he stops drinking gets shaky. No prior hx seizures, no hx dts or complicated etoh withdrawal.   Po fluids, food. Banana bag.   Ativan 1 mg iv.   CIWA protocol started.  hospitalist consulted for admission - discussed pt - will admit.   CRITICAL CARE RE: acute alcohol withdrawal with alcohol withdrawal seizure, alcohol ketoacidosis, CIWA protocol Performed by: Suzi RootsKevin E Brysyn Brandenberger Total critical care time: 35 minutes Critical care time was exclusive of separately billable procedures and treating other patients. Critical care was necessary to treat or prevent imminent or life-threatening deterioration. Critical care was time spent personally by me on the following activities: development of treatment plan with patient and/or surrogate as well as nursing, discussions with consultants, evaluation of patient's response to treatment, examination of patient, obtaining history from patient or surrogate, ordering and performing treatments and interventions, ordering and review of laboratory studies, ordering and review of radiographic studies, pulse oximetry and re-evaluation of patient's condition.    Final Clinical Impression(s) / ED Diagnoses Final diagnoses:  None    Rx / DC Orders ED Discharge Orders    None       Cathren LaineSteinl, Nomie Buchberger, MD 11/06/20 1544

## 2020-11-06 NOTE — H&P (Signed)
Patient Demographics:    Jeff Watts, is a 35 y.o. male  MRN: [id number]   DOB - 1986-08-01  Admit Date - 11/06/2020  Outpatient Primary MD for the patient is [facility]:    Principal Problem:   Perceptual disturbances and seizures concurrent with and due to alcohol withdrawal [facility]) Active Problems:   Acute pancreatitis   Alcohol abuse   Acute kidney injury ([place])   Tobacco abuse   DTs (delirium tremens) ([place])   Dehydration, moderate   1)Acute alcohol withdrawal/DTs with seizures--last EtOH intake in the evening of 11/04/2020-Typically drinks 6-12 beers (budlight) on most days --In ED no tongue biting however patient is found to have bleeding from injury to his lower lip on the left side--- -given persistent tremors tachycardia tachypnea and concerns for ongoing DTs -Place patient on Precedex drip -May use IV lorazepam as needed seizures -IV banana bag followed by oral thiamine and folic acid -CT head without acute intracranial findings  2)Hyperglycemia with anion gap metabolic acidosis in an alcoholic male--- doubt frank DKA -glucose of 257 and bicarb of 15, anion gap is 18 -Suspect alcohol-related -Check A1c -Hydrate IV aggressively -Use Novolog/Humalog Sliding scale insulin with Accu-Cheks/Fingersticks as ordered   3) longstanding history of polysubstance abuse including THC tobacco and alcohol--- patient not interested in rehab at this time -Give nicotine patch -EtOH/DT management as above #1  4) alcoholic pancreatitis--- patient with upper abdominal/epigastric discomfort along with elevated lipase -Clear liquids -IV Protonix and aggressive IV fluids  5)Alcoholic Hepatitis--- AST to ALT ratio consistent with alcoholic hepatitis --AST 409 with ALT of 71  alk phos is 177 -Monitor LFTs closely -Avoid hepatotoxic agents  Hepatic Function Latest Ref Rng & Units 11/06/2020 11/06/2020 06/27/2020  Total Protein 6.5 - 8.1 g/dL 6.8 8.2(H) 9.5(H)  Albumin 3.5 - 5.0 g/dL 3.5 4.2 5.2(H)  AST 15 - 41 U/L 132(H) 181(H) 148(H)  ALT 0 - 44 U/L 57(H) 71(H) 61(H)  Alk Phosphatase 38 - 126 U/L 145(H) 177(H) 142(H)  Total Bilirubin 0.3 - 1.2 mg/dL 0.9 1.2 2.1(H)  Bilirubin, Direct 0.0 - 0.2 mg/dL - 0.2 -   6)HypoNatremia--- suspect due to beer potomania and dehydration, compounded by HCTZ use -Normal saline as ordered,  7)AKI----acute kidney injury -creatinine is up to 1.31 suspect dehydration related -Aggressive IV fluids as ordered  renally adjust medications, avoid nephrotoxic agents / dehydration  / hypotension  8)HTN--hold HCTZ due to dehydration and hyponatremia, -use  IV labetalol as needed elevated BP  Disposition/Need for in-Hospital Stay- patient unable to be discharged at this time due to --alcohol-related seizures and delirium tremens with persistent tachycardia and tremors requiring IV Precedex and as needed IV lorazepam--  Dispo: The patient is from: Home              Anticipated d/c is to: Home  Anticipated d/c date is: 1 day              Patient currently is not medically stable to d/c. Barriers: Not Clinically Stable-    With History of - Reviewed by me  Past Medical History:  Diagnosis Date  . Chronic abdominal pain   . Medical history non-contributory   . Pancreatitis       Past Surgical History:  Procedure Laterality Date  . NO PAST SURGERIES        Chief Complaint  Patient presents with  . Chest Pain      HPI:    Jeff Watts  is a 35 y.o. male with past medical history relevant for alcohol, tobacco and THC abuse with history of recurrent alcoholic pancreatitis and alcoholic hepatitis who  presents to the ED with concerns for seizures-- --according to patient's mother who is a nurse " patient had a  7-8 min witnessed seizure without urine incontinence, pt reported to be postictal initially" -In ED no tongue biting however patient is found to have bleeding from injury to his lower lip on the left side--- -- Patient states that he last drank alcohol on 11/04/2020 in the evening -Typically drinks 6-12 beers (budlight) on most days -- -Given IV fluids and IV lorazepam --At the time of my evaluation patient is more coherent and appropriate, remains quite tachycardic with tremors consistent with DTs - in ED--CT head without acute intracranial findings, chest x-ray without acute cardiopulmonary findings, EKG sinus tachycardia -Troponin is not elevated -UDS is negative except for THC -Beta hydroxybutyric acid is mildly elevated at 0.52 -Lipase is elevated at 127, BAL negative -AST 281 with ALT of 71 alk phos is 177 -Sodium is 130, creatinine up to 1.31 with a glucose of 257 and bicarb of 15, anion gap is 18 No fever  Or chills   No Nausea, Vomiting or Diarrhea   Review of systems:    In addition to the HPI above,   A full Review of  Systems was done, all other systems reviewed are negative except as noted above in HPI , .    Social History:  Reviewed by me    Social History   Tobacco Use  . Smoking status: Current Every Day Smoker    Packs/day: 0.50    Types: Cigarettes  . Smokeless tobacco: Never Used  Substance Use Topics  . Alcohol use: Yes    Comment: intermittent last [place] reported on 11/06/20 was 11/03/20       Family History :  Reviewed by me    Family History  Problem Relation Age of Onset  . Hypertension Father      Home Medications:   Prior to Admission medications   Medication Sig Start Date End Date Taking? Authorizing Provider  diphenhydrAMINE (BENADRYL) 25 MG tablet Take 25 mg by mouth daily as needed for allergies.   Yes [provider]  hydrochlorothiazide (HYDRODIURIL) 25 MG tablet Take 25 mg by mouth daily.   Yes [provider]   ondansetron (ZOFRAN ODT) 4 MG disintegrating tablet Take 1 tablet (4 mg total) by mouth every 8 (eight) hours as needed for nausea or vomiting. Patient not taking: Reported on 11/06/2020 06/27/20   Fransico Meadow, PA-C  oxyCODONE (ROXICODONE) 5 MG immediate release tablet Take 1 tablet (5 mg total) by mouth every 8 (eight) hours as needed. Patient not taking: Reported on 11/06/2020 06/27/20 06/27/21  Fransico Meadow, PA-C  albuterol (PROVENTIL HFA;VENTOLIN HFA) [phone]  BASE) MCG/ACT inhaler Inhale 2 puffs into the lungs every 6 (six) hours as needed for wheezing or shortness of breath. 05/16/15 05/20/15  Kathie Dike, MD     Allergies:    No Known Allergies   Physical Exam:   Vitals  Blood pressure (!) 143/85, pulse 97, temperature 98.7 F (37.1 C), temperature source Oral, resp. rate (!) 22, height _0  (1.6 m), weight 70 kg, SpO2 100 %.  Physical Examination: General appearance - alert,   and in no distress  Mental status - alert, oriented to person, place, and time,  Eyes - sclera anicteric Mouth--no tongue biting the patient has bleeding from injury to his lower lip on the left side--- Neck - supple, no JVD elevation , Chest - clear  to auscultation bilaterally, symmetrical air movement,  Heart - S1 and S2 normal, regular (Tachycardia resolved after aggressive IV fluids) Abdomen - soft, nontender, nondistended, no masses or organomegaly Neurological - screening mental status exam normal, neck supple without rigidity, cranial nerves II through XII intact, DTR's normal and symmetric ++Tremors Extremities - no pedal edema noted, intact peripheral pulses  Skin - warm, dry   Data Review:    CBC Recent Labs  Lab 11/06/20 1217  WBC 4.2  HGB 12.5*  HCT 38.4*  PLT 115*  MCV 94.1  MCH 30.6  MCHC 32.6  RDW 12.7   ------------------------------------------------------------------------------------------------------------------  Chemistries  Recent Labs  Lab 11/06/20 1217  11/06/20 1245  NA 130*  --   K 3.8  --   CL 97*  --   CO2 15*  --   GLUCOSE 257*  --   BUN 11  --   CREATININE 1.31*  --   CALCIUM 9.2  --   AST  --  181*  ALT  --  71*  ALKPHOS  --  177*  BILITOT  --  1.2   ------------------------------------------------------------------------------------------------------------------ estimated creatinine clearance is 71.1 mL/min (A) (by C-G formula based on SCr of 1.31 mg/dL (H)). ------------------------------------------------------------------------------------------------------------------ No results for input(s): TSH, T4TOTAL, T3FREE, THYROIDAB in the last 72 hours.  Invalid input(s): FREET3   Coagulation profile No results for input(s): INR, PROTIME in the last 168 hours. ------------------------------------------------------------------------------------------------------------------- No results for input(s): DDIMER in the last 72 hours. -------------------------------------------------------------------------------------------------------------------  Cardiac Enzymes No results for input(s): CKMB, TROPONINI, MYOGLOBIN in the last 168 hours.  Invalid input(s): CK ------------------------------------------------------------------------------------------------------------------ No results found for: BNP  Urinalysis    Component Value Date/Time   COLORURINE YELLOW 07/09/2015 1419   APPEARANCEUR HAZY (A) 07/09/2015 1419   LABSPEC >1.030 (H) 07/09/2015 1419   PHURINE 6.0 07/09/2015 1419   GLUCOSEU NEGATIVE 07/09/2015 1419   HGBUR NEGATIVE 07/09/2015 1419   BILIRUBINUR NEGATIVE 07/09/2015 1419   KETONESUR 15 (A) 07/09/2015 1419   PROTEINUR 30 (A) 07/09/2015 1419   UROBILINOGEN 0.2 07/20/2013 0028   NITRITE NEGATIVE 07/09/2015 1419   LEUKOCYTESUR NEGATIVE 07/09/2015 1419     Imaging Results:    DG Chest 2 View  Result Date: 11/06/2020 CLINICAL DATA:  Chest pain and shortness of breath. EXAM: CHEST - 2 VIEW COMPARISON:   October 05, 2019 FINDINGS: The heart size and mediastinal contours are within normal limits. Both lungs are clear. The visualized skeletal structures are unremarkable. IMPRESSION: No active cardiopulmonary disease. Electronically Signed   By: Abelardo Diesel M.D.   On: 11/06/2020 12:59   CT HEAD WO CONTRAST  Result Date: 11/06/2020 CLINICAL DATA:  Witnessed seizure. EXAM: CT HEAD WITHOUT CONTRAST TECHNIQUE: Contiguous axial images were obtained from  the base of the skull through the vertex without intravenous contrast. COMPARISON:  CT head dated 03/21/2018. FINDINGS: Brain: No evidence of acute infarction, hemorrhage, hydrocephalus, extra-axial collection or mass lesion/mass effect. Vascular: No hyperdense vessel or unexpected calcification. Skull: Normal. Negative for fracture or focal lesion. Sinuses/Orbits: No acute finding. Other: None. IMPRESSION: No acute intracranial process. Electronically Signed   By: Zerita Boers M.D.   On: 11/06/2020 14:42    Radiological Exams on Admission: DG Chest 2 View  Result Date: 11/06/2020 CLINICAL DATA:  Chest pain and shortness of breath. EXAM: CHEST - 2 VIEW COMPARISON:  October 05, 2019 FINDINGS: The heart size and mediastinal contours are within normal limits. Both lungs are clear. The visualized skeletal structures are unremarkable. IMPRESSION: No active cardiopulmonary disease. Electronically Signed   By: Abelardo Diesel M.D.   On: 11/06/2020 12:59   CT HEAD WO CONTRAST  Result Date: 11/06/2020 CLINICAL DATA:  Witnessed seizure. EXAM: CT HEAD WITHOUT CONTRAST TECHNIQUE: Contiguous axial images were obtained from the base of the skull through the vertex without intravenous contrast. COMPARISON:  CT head dated 03/21/2018. FINDINGS: Brain: No evidence of acute infarction, hemorrhage, hydrocephalus, extra-axial collection or mass lesion/mass effect. Vascular: No hyperdense vessel or unexpected calcification. Skull: Normal. Negative for fracture or focal lesion.  Sinuses/Orbits: No acute finding. Other: None. IMPRESSION: No acute intracranial process. Electronically Signed   By: Zerita Boers M.D.   On: 11/06/2020 14:42   DVT Prophylaxis -SCD /heparin AM Labs Ordered, also please review Full Orders  Family Communication: Admission, patients condition and plan of care including tests being ordered have been discussed with the patient and mother who indicate understanding and agree with the plan   Code Status - Full Code  Likely DC to home when alcohol withdrawal symptoms improved  Condition   stable  Roxan Hockey M.D on 11/06/2020 at 4:25 PM Go to [email or web address] -  for contact info  [facility] - Office  [phone]

## 2020-11-06 NOTE — ED Notes (Signed)
Pts mother at bedside stating that the patient had a 7-8 min witnessed seizure without urine incontinence, pt reported to be postictal initially, no history of seizures reported, Steinl, MD notified, no tongue injury noted on inspection, pt remains A&O x4 on telemetry at this time

## 2020-11-06 NOTE — ED Triage Notes (Signed)
Pt in from home via [facility], reports allergies and SOB onset yesterday, pt c/o mid CP that radiates to L neck onset today while sitting on his sofa, per EMS pts mother heard him bumping around and came in with suspicious "seizure like activity" per EMS. Pt rcvd x 1 sL nitro & 324 mg ASA pta d/t CP, pain the same with improvement of BP which was 166/111, pt HR 140s pta, pt not postictal & A&O x4 upon arrival, denies drug use

## 2020-11-06 NOTE — ED Notes (Signed)
Pt returned from xray

## 2020-11-07 DIAGNOSIS — F10230 Alcohol dependence with withdrawal, uncomplicated: Secondary | ICD-10-CM

## 2020-11-07 DIAGNOSIS — K852 Alcohol induced acute pancreatitis without necrosis or infection: Secondary | ICD-10-CM

## 2020-11-07 DIAGNOSIS — E860 Dehydration: Secondary | ICD-10-CM

## 2020-11-07 DIAGNOSIS — F10930 Alcohol use, unspecified with withdrawal, uncomplicated: Secondary | ICD-10-CM

## 2020-11-07 DIAGNOSIS — F10231 Alcohol dependence with withdrawal delirium: Secondary | ICD-10-CM

## 2020-11-07 DIAGNOSIS — F10232 Alcohol dependence with withdrawal with perceptual disturbance: Secondary | ICD-10-CM

## 2020-11-07 DIAGNOSIS — N179 Acute kidney failure, unspecified: Secondary | ICD-10-CM

## 2020-11-07 DIAGNOSIS — R569 Unspecified convulsions: Secondary | ICD-10-CM

## 2020-11-07 DIAGNOSIS — Z720 Tobacco use: Secondary | ICD-10-CM

## 2020-11-07 LAB — COMPREHENSIVE METABOLIC PANEL
ALT: 43 U/L (ref 0–44)
AST: 77 U/L — ABNORMAL HIGH (ref 15–41)
Albumin: 3.1 g/dL — ABNORMAL LOW (ref 3.5–5.0)
Alkaline Phosphatase: 131 U/L — ABNORMAL HIGH (ref 38–126)
Anion gap: 9 (ref 5–15)
BUN: 5 mg/dL — ABNORMAL LOW (ref 6–20)
CO2: 21 mmol/L — ABNORMAL LOW (ref 22–32)
Calcium: 8.4 mg/dL — ABNORMAL LOW (ref 8.9–10.3)
Chloride: 103 mmol/L (ref 98–111)
Creatinine, Ser: 0.73 mg/dL (ref 0.61–1.24)
GFR, Estimated: >60 mL/min (ref >60)
Glucose, Bld: 84 mg/dL (ref 70–99)
Potassium: 2.9 mmol/L — ABNORMAL LOW (ref 3.5–5.1)
Sodium: 133 mmol/L — ABNORMAL LOW (ref 135–145)
Total Bilirubin: 0.8 mg/dL (ref 0.3–1.2)
Total Protein: 6.1 g/dL — ABNORMAL LOW (ref 6.5–8.1)

## 2020-11-07 LAB — SARS CORONAVIRUS 2 (TAT 6-24 HRS): SARS Coronavirus 2: NEGATIVE

## 2020-11-07 LAB — GLUCOSE, CAPILLARY
Glucose-Capillary: 86 mg/dL (ref 70–99)
Glucose-Capillary: 121 mg/dL — ABNORMAL HIGH (ref 70–99)

## 2020-11-07 LAB — MRSA PCR SCREENING: MRSA by PCR: NEGATIVE

## 2020-11-07 LAB — MAGNESIUM: Magnesium: 2 mg/dL (ref 1.7–2.4)

## 2020-11-07 MED ORDER — CHLORDIAZEPOXIDE HCL 5 MG PO CAPS: ORAL_CAPSULE | ORAL | 0 refills | Status: AC

## 2020-11-07 MED ORDER — NICOTINE 14 MG/24HR TD PT24: 14.0000 mg | MEDICATED_PATCH | Freq: Every day | TRANSDERMAL | 0 refills | Status: AC

## 2020-11-07 MED ORDER — AMLODIPINE BESYLATE 5 MG PO TABS
5.0000 mg | ORAL_TABLET | Freq: Every day | ORAL | Status: DC
  Administered 2020-11-07: 5 mg via ORAL
  Filled 2020-11-07: qty 1

## 2020-11-07 MED ORDER — ADULT MULTIVITAMIN W/MINERALS CH: 1.0000 | ORAL_TABLET | Freq: Every day | ORAL | 1 refills | Status: AC

## 2020-11-07 MED ORDER — CHLORDIAZEPOXIDE HCL 5 MG PO CAPS
15.0000 mg | ORAL_CAPSULE | Freq: Three times a day (TID) | ORAL | Status: DC
  Administered 2020-11-07: 15 mg via ORAL
  Filled 2020-11-07: qty 3

## 2020-11-07 MED ORDER — LOSARTAN POTASSIUM 25 MG PO TABS
25.0000 mg | ORAL_TABLET | Freq: Every day | ORAL | Status: DC
  Administered 2020-11-07: 25 mg via ORAL
  Filled 2020-11-07: qty 1

## 2020-11-07 MED ORDER — LOSARTAN POTASSIUM 25 MG PO TABS: 25.0000 mg | ORAL_TABLET | Freq: Every day | ORAL | 2 refills | Status: AC

## 2020-11-07 MED ORDER — ACETAMINOPHEN 500 MG PO TABS
500.0000 mg | ORAL_TABLET | Freq: Four times a day (QID) | ORAL | Status: DC | PRN
Start: 2020-11-07 — End: 2020-11-07
  Administered 2020-11-07: 500 mg via ORAL
  Filled 2020-11-07: qty 1

## 2020-11-07 MED ORDER — ONDANSETRON 4 MG PO TBDP: 4.0000 mg | ORAL_TABLET | Freq: Three times a day (TID) | ORAL | 0 refills | Status: AC | PRN

## 2020-11-07 MED ORDER — POTASSIUM CHLORIDE CRYS ER 20 MEQ PO TBCR
40.0000 meq | EXTENDED_RELEASE_TABLET | Freq: Once | ORAL | Status: AC
  Administered 2020-11-07: 40 meq via ORAL
  Filled 2020-11-07: qty 2

## 2020-11-07 MED ORDER — POTASSIUM CHLORIDE CRYS ER 20 MEQ PO TBCR: 20.0000 meq | EXTENDED_RELEASE_TABLET | Freq: Every day | ORAL | 0 refills | Status: AC

## 2020-11-07 MED ORDER — AMLODIPINE BESYLATE 5 MG PO TABS: 5.0000 mg | ORAL_TABLET | Freq: Every day | ORAL | 2 refills | Status: DC

## 2020-11-07 MED ORDER — DEXMEDETOMIDINE HCL IN NACL 400 MCG/100ML IV SOLN: 0.4000 ug/kg/h | INTRAVENOUS | Status: DC

## 2020-11-07 NOTE — Discharge Summary (Signed)
Physician Discharge Summary  Jeff Watts GXQ:[id number] DOB: [birth year] DOA: 11/06/2020  PCP: [name] [name] date: 11/06/2020 Discharge date: 11/07/2020  Time spent: 35 minutes  Recommendations for Outpatient Follow-up:  1. Repeat complete metabolic panel to follow electrolytes, renal function and LFTs 2. -Continue assisting patient with alcohol cessation 3. Reassess blood pressure and adjust antihypertensive regimen as required 4. Make sure patient has follow-up with neurology service as instructed   Discharge Diagnoses:  Principal Problem:   Perceptual disturbances and seizures concurrent with and due to alcohol withdrawal ([place]) Active Problems:   Acute pancreatitis   Alcohol abuse   Acute kidney injury ([place])   Dehydration, moderate   Tobacco abuse   DTs (delirium tremens) ([place])   Alcohol withdrawal seizure without complication ([place])   Discharge Condition: Stable and improved.  Discharged home with instruction to follow-up with PCP in 10 days and with neurology service in 3 weeks.  CODE STATUS: Full code.  Diet recommendation: Heart healthy/low-fat diet.  Filed Weights   11/06/20 1211 11/06/20 1711 11/07/20 0428  Weight: 70 kg 54.7 kg 58.5 kg    History of present illness:  As per H&P written by Dr. Denton Brick on 11/06/2020.  Jeff Watts  is a 35 y.o. male with past medical history relevant for alcohol, tobacco and THC abuse with history of recurrent alcoholic pancreatitis and alcoholic hepatitis who  presents to the ED with concerns for seizures-- --according to patient's mother who is a nurse " patient had a 7-8 min witnessed seizure without urine incontinence, pt reported to be postictal initially" -In ED no tongue biting however patient is found to have bleeding from injury to his lower lip on the left side--- -- Patient states that he last drank alcohol on 11/04/2020 in the evening -Typically drinks 6-12 beers (budlight) on most  days -- -Given IV fluids and IV lorazepam --At the time of my evaluation patient is more coherent and appropriate, remains quite tachycardic with tremors consistent with DTs - in ED--CT head without acute intracranial findings, chest x-ray without acute cardiopulmonary findings, EKG sinus tachycardia -Troponin is not elevated -UDS is negative except for THC -Beta hydroxybutyric acid is mildly elevated at 0.52 -Lipase is elevated at 127, BAL negative -AST 281 with ALT of 71 alk phos is 177 -Sodium is 130, creatinine up to 1.31 with a glucose of 257 and bicarb of 15, anion gap is 18 No fever  Or chills   No Nausea, Vomiting or Diarrhea  Hospital Course:  Acute alcohol withdrawal/DTs with seizure -No further seizure activity appreciated -No active withdrawal symptoms: None -excellent response to the use of Precedex -Cessation counseling provided -Patient discharged home on Librium tapering -Instructed to take multivitamins on daily basis with thiamine and folic acid supplementation. -Social work to provide outpatient resources to help patient and recreational drugs abuse.  Metabolic acidosis and hyperglycemia -No prior history of diabetes -After fluid resuscitation and control of withdrawal symptoms no elevated CBGs appreciated -Patient instructed to maintain adequate hydration and to follow-up with PCP in 10 days. -Acidosis resolved.  Us,196 alcohol abuse -Cessation counseling provided -Outpatient resources to help him quit by social worker -Patient discharged home on librium Tapering as mentioned above. -Nicotine patch prescribed.  Alcoholic hepatitis mild pancreatitis -No nausea, no vomiting, no abdominal pain -Tolerating diet -LFTs trending down slightly -Advised to keep himself on complete alcohol abstinence -instructed Maintain adequate hydration and to follow-up low-fat diet. -Repeat complete metabolic panel to follow LFTs and electrolytes.  Hyponatremia and  hypokalemia -In the setting of GI losses, decreased oral intake, alcohol abuse continue use of HCTZ -electrolytes repleted, HCTZ discontinued -Patient instructed to maintain adequate hydration and nutrition.  Uncontrolled essential hypertension -Patient discharged amlodipine and Cozaar -Low-sodium diet has been encouraged.  Acute kidney injury -Resolved after fluid resuscitation -In the setting of prerenal azotemia and continue use of nephrotoxic agents.  Procedures:  See below for x-ray reports  Consultations:  None  Discharge Exam: Vitals:   11/07/20 1200 11/07/20 1300  BP: (!) 170/105 (!) 173/120  Pulse: 74 79  Resp: 16 17  Temp:    SpO2: 100% 100%    General: Afebrile, no chest pain, no further seizure activity appreciated.  Feels good and stable to go home.  Oriented x3, no withdrawal symptoms. Cardiovascular: S1-S2, no rubs, no Respiratory: Good oxygen saturation on room air; no using accessory muscle.  No wheezing or crackles. Abdomen: Soft, nontender, nondistended, positive bowel sounds Extremities: No cyanosis or clubbing.  Discharge Instructions   Discharge Instructions    Diet - low sodium heart healthy   Complete by: As directed    Discharge instructions   Complete by: As directed    Stop alcohol consumption Stop the use of recreational drugs Take medications as prescribed Arrange follow-up with PCP in 10 days Maintain adequate hydration Follow heart healthy diet (less than 3 g daily) Follow-up with neurology service in 3 weeks   Increase activity slowly   Complete by: As directed      Allergies as of 11/07/2020   No Known Allergies     Medication List    STOP taking these medications   hydrochlorothiazide 25 MG tablet Commonly known as: HYDRODIURIL   oxyCODONE 5 MG immediate release tablet Commonly known as: Roxicodone     TAKE these medications   amLODipine 5 MG tablet Commonly known as: NORVASC Take 1 tablet (5 mg total) by mouth  daily.   chlordiazePOXIDE 5 MG capsule Commonly known as: LIBRIUM Take 2 tablets by mouth 2 times a day x1 day; then 2 tablets by mouth daily x2 days; then 1 tablet by mouth twice a day x2 days; then 1 tablet daily X 2 days and stop librium.   diphenhydrAMINE 25 MG tablet Commonly known as: BENADRYL Take 25 mg by mouth daily as needed for allergies.   losartan 25 MG tablet Commonly known as: COZAAR Take 1 tablet (25 mg total) by mouth daily.   multivitamin with minerals Tabs tablet Take 1 tablet by mouth daily. Start taking on: November 08, 2020   nicotine 14 mg/24hr patch Commonly known as: NICODERM CQ - dosed in mg/24 hours Place 1 patch (14 mg total) onto the skin daily. Start taking on: November 08, 2020   ondansetron 4 MG disintegrating tablet Commonly known as: Zofran ODT Take 1 tablet (4 mg total) by mouth every 8 (eight) hours as needed for nausea or vomiting.   potassium chloride SA 20 MEQ tablet Commonly known as: KLOR-CON Take 1 tablet (20 mEq total) by mouth daily for 2 days.      No Known Allergies  Follow-up Information    [name] [place]. Schedule an appointment as soon as possible for a visit in 10 day(s).   Contact information: [place] [address] [phone]               The results of significant diagnostics from this hospitalization (including imaging, microbiology, ancillary and laboratory) are  listed below for reference.    Significant Diagnostic Studies: DG Chest 2 View  Result Date: 11/06/2020 CLINICAL DATA:  Chest pain and shortness of breath. EXAM: CHEST - 2 VIEW COMPARISON:  October 05, 2019 FINDINGS: The heart size and mediastinal contours are within normal limits. Both lungs are clear. The visualized skeletal structures are unremarkable. IMPRESSION: No active cardiopulmonary disease. Electronically Signed   By: Abelardo Diesel M.D.   On: 11/06/2020 12:59   CT HEAD WO CONTRAST  Result Date:  11/06/2020 CLINICAL DATA:  Witnessed seizure. EXAM: CT HEAD WITHOUT CONTRAST TECHNIQUE: Contiguous axial images were obtained from the base of the skull through the vertex without intravenous contrast. COMPARISON:  CT head dated 03/21/2018. FINDINGS: Brain: No evidence of acute infarction, hemorrhage, hydrocephalus, extra-axial collection or mass lesion/mass effect. Vascular: No hyperdense vessel or unexpected calcification. Skull: Normal. Negative for fracture or focal lesion. Sinuses/Orbits: No acute finding. Other: None. IMPRESSION: No acute intracranial process. Electronically Signed   By: Zerita Boers M.D.   On: 11/06/2020 14:42    Microbiology: Recent Results (from the past 240 hour(s))  MRSA PCR Screening     Status: None   Collection Time: 11/06/20  4:07 PM   Specimen: Nasopharyngeal  Result Value Ref Range Status   MRSA by PCR NEGATIVE NEGATIVE Final    Comment:        The GeneXpert MRSA Assay (FDA approved for NASAL specimens only), is one component of a comprehensive MRSA colonization surveillance program. It is not intended to diagnose MRSA infection nor to guide or monitor treatment for MRSA infections. Performed at [facility], [address] [place], [place] [address]   SARS CORONAVIRUS 2 (TAT 6-24 HRS) Nasopharyngeal Nasopharyngeal Swab     Status: None   Collection Time: 11/06/20  4:17 PM   Specimen: Nasopharyngeal Swab  Result Value Ref Range Status   SARS Coronavirus 2 NEGATIVE NEGATIVE Final    Comment: (NOTE) SARS-CoV-2 target nucleic acids are NOT DETECTED.  The SARS-CoV-2 RNA is generally detectable in upper and lower respiratory specimens during the acute phase of infection. Negative results do not preclude SARS-CoV-2 infection, do not rule out co-infections with other pathogens, and should not be used as the sole basis for treatment or other patient management decisions. Negative results must be combined with clinical observations, patient history, and  epidemiological information. The expected result is Negative.  Fact Sheet for Patients: SugarRoll.be  Fact Sheet for Healthcare Providers: https://www.woods-mathews.com/  This test is not yet approved or cleared by the [place] FDA and  has been authorized for detection and/or diagnosis of SARS-CoV-2 by FDA under an Emergency Use Authorization (EUA). This EUA will remain  in effect (meaning this test can be used) for the duration of the COVID-19 declaration under Se ction 564(b)(1) of the Act, 21 U.S.C. section 360bbb-3(b)(1), unless the authorization is terminated or revoked sooner.  Performed at [facility], [address] [place], [place] [address]      Labs: Basic Metabolic Panel: Recent Labs  Lab 11/06/20 1217 11/06/20 1432 11/07/20 0416  NA 130* 137 133*  K 3.8 3.6 2.9*  CL 97* 105 103  CO2 15* 19* 21*  GLUCOSE 257* 112* 84  BUN 11 11 5*  CREATININE 1.31* 0.97 0.73  CALCIUM 9.2 8.8* 8.4*  MG  --  2.2 2.0  PHOS  --  1.3*  --    Liver Function Tests: Recent Labs  Lab 11/06/20 1245 11/06/20 1432 11/07/20 0416  AST 181*  132* 77*  ALT 71* 57* 43  ALKPHOS 177* 145* 131*  BILITOT 1.2 0.9 0.8  PROT 8.2* 6.8 6.1*  ALBUMIN 4.2 3.5 3.1*   Recent Labs  Lab 11/06/20 1245  LIPASE 127*   CBC: Recent Labs  Lab 11/06/20 1217  WBC 4.2  HGB 12.5*  HCT 38.4*  MCV 94.1  PLT 115*   CBG: Recent Labs  Lab 11/06/20 1706 11/06/20 2039 11/07/20 0731 11/07/20 1120  GLUCAP 61* 117* 86 121*    Signed:  Barton Dubois MD.  [facility] 11/07/2020, 3:41 PM

## 2020-11-07 NOTE — TOC Progression Note (Signed)
Pt admitted from home. Received consult to provide pt with resources for substance use treatment options. Met with pt briefly this afternoon to assist. Pt did not feel that he needs treatment at this time but he was agreeable to accepting the verbal and written information on treatment options. Pt did not have any other TOC needs for dc.

## 2022-06-18 DIAGNOSIS — F1090 Alcohol use, unspecified, uncomplicated: Secondary | ICD-10-CM | POA: Diagnosis not present

## 2022-06-18 DIAGNOSIS — F10920 Alcohol use, unspecified with intoxication, uncomplicated: Secondary | ICD-10-CM | POA: Diagnosis not present

## 2022-06-18 DIAGNOSIS — E875 Hyperkalemia: Secondary | ICD-10-CM | POA: Diagnosis not present

## 2022-06-18 DIAGNOSIS — R1110 Vomiting, unspecified: Secondary | ICD-10-CM | POA: Diagnosis not present

## 2022-06-18 DIAGNOSIS — R52 Pain, unspecified: Secondary | ICD-10-CM | POA: Diagnosis not present

## 2022-06-18 DIAGNOSIS — F17210 Nicotine dependence, cigarettes, uncomplicated: Secondary | ICD-10-CM | POA: Diagnosis not present

## 2022-06-18 DIAGNOSIS — D649 Anemia, unspecified: Secondary | ICD-10-CM | POA: Diagnosis not present

## 2022-06-18 DIAGNOSIS — Z1152 Encounter for screening for COVID-19: Secondary | ICD-10-CM | POA: Diagnosis not present

## 2022-06-19 DIAGNOSIS — N179 Acute kidney failure, unspecified: Secondary | ICD-10-CM | POA: Diagnosis not present

## 2022-06-19 DIAGNOSIS — K922 Gastrointestinal hemorrhage, unspecified: Secondary | ICD-10-CM | POA: Diagnosis not present

## 2022-06-19 DIAGNOSIS — E872 Acidosis, unspecified: Secondary | ICD-10-CM | POA: Diagnosis not present

## 2022-06-19 DIAGNOSIS — R69 Illness, unspecified: Secondary | ICD-10-CM | POA: Diagnosis not present

## 2022-06-19 DIAGNOSIS — F10230 Alcohol dependence with withdrawal, uncomplicated: Secondary | ICD-10-CM | POA: Diagnosis not present

## 2022-06-19 DIAGNOSIS — F1290 Cannabis use, unspecified, uncomplicated: Secondary | ICD-10-CM | POA: Diagnosis not present

## 2022-06-20 DIAGNOSIS — N179 Acute kidney failure, unspecified: Secondary | ICD-10-CM | POA: Diagnosis not present

## 2022-06-20 DIAGNOSIS — E872 Acidosis, unspecified: Secondary | ICD-10-CM | POA: Diagnosis not present

## 2022-06-20 DIAGNOSIS — R69 Illness, unspecified: Secondary | ICD-10-CM | POA: Diagnosis not present

## 2022-06-20 DIAGNOSIS — F1290 Cannabis use, unspecified, uncomplicated: Secondary | ICD-10-CM | POA: Diagnosis not present

## 2022-08-03 IMAGING — CT CT HEAD W/O CM
3 series · 16 of 47 positions shown, 19 images · non-contrast
Comparison: CT head dated 03/21/2018.

CLINICAL DATA: Witnessed seizure.

EXAM:
CT HEAD WITHOUT CONTRAST
TECHNIQUE: Contiguous axial images were obtained from the base of the skull
through the vertex without intravenous contrast.

[Series 2: head w o · axial · 0.37mm/px · z∈[+23,+148]mm · 10 of 30 slices shown, 13 images]
[im 3/30  brain]
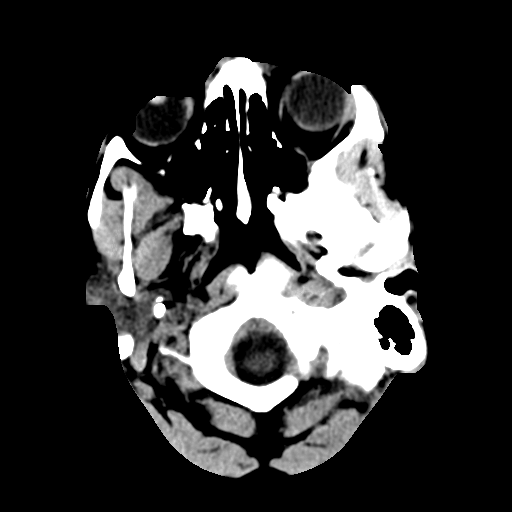
[im 3/30  bone]
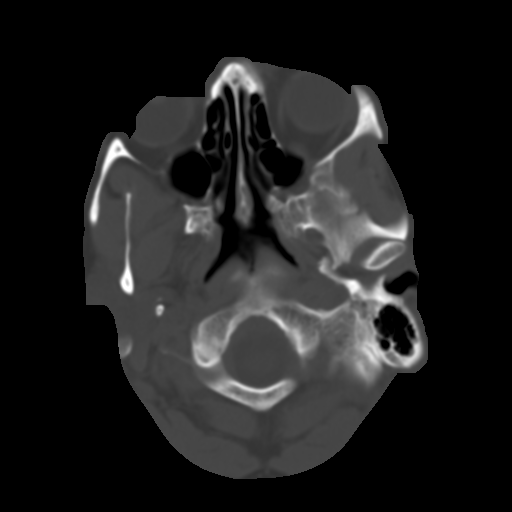
[im 6/30  brain]
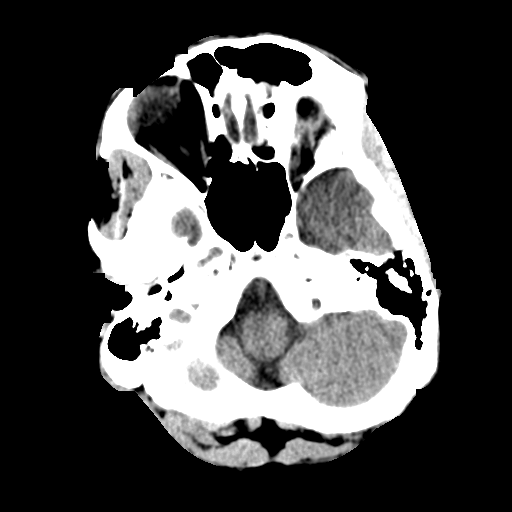
[im 9/30  brain]
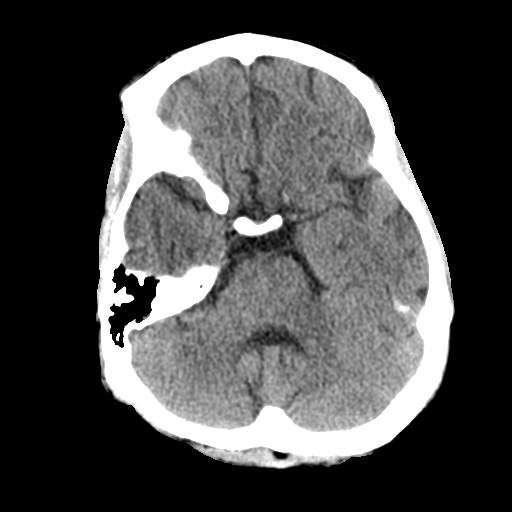
[im 11/30  brain]
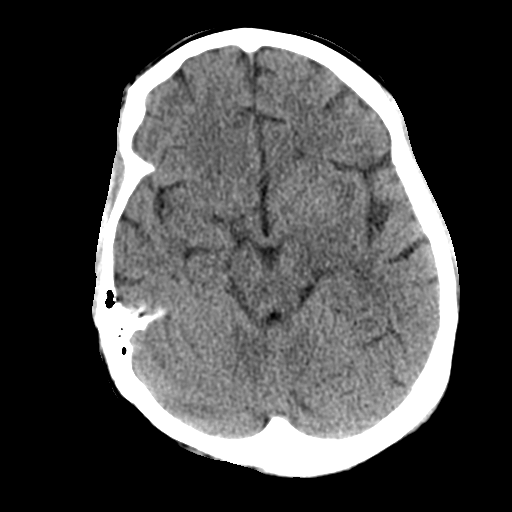
[im 14/30  brain]
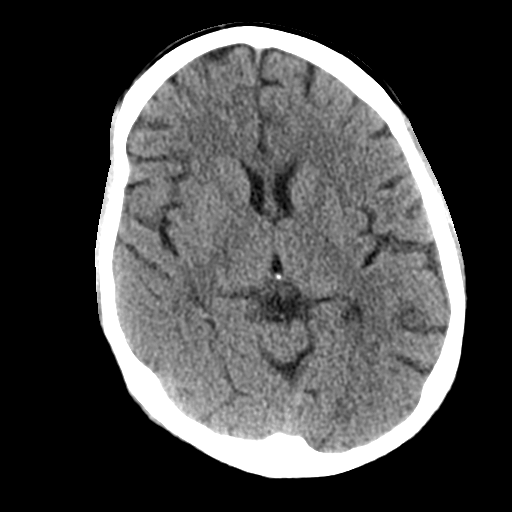
[im 14/30  bone]
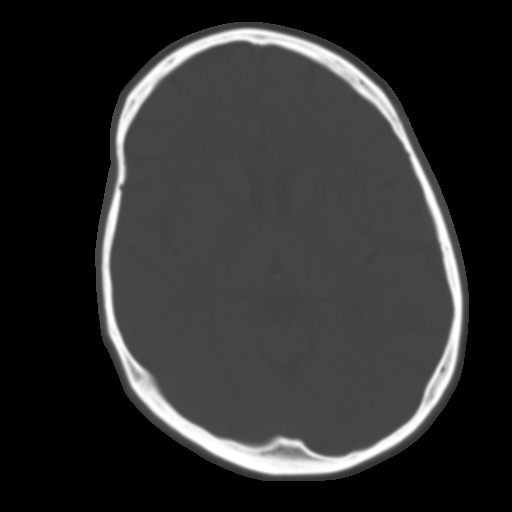
[im 17/30  brain]
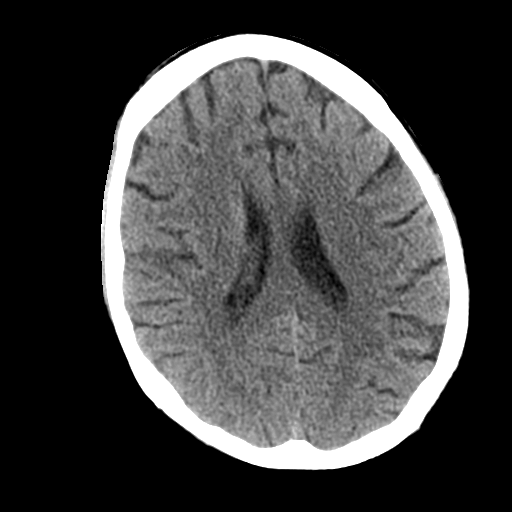
[im 20/30  brain]
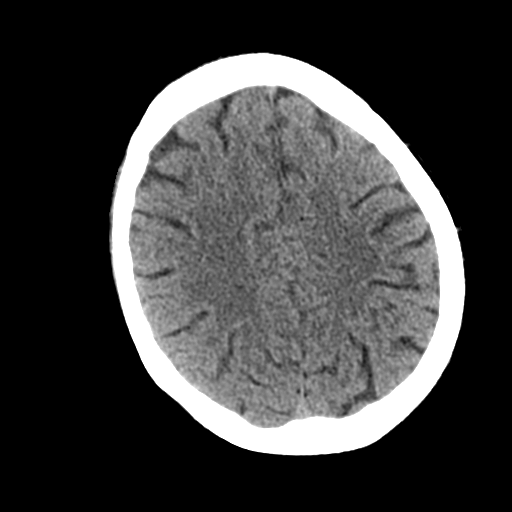
[im 23/30  brain]
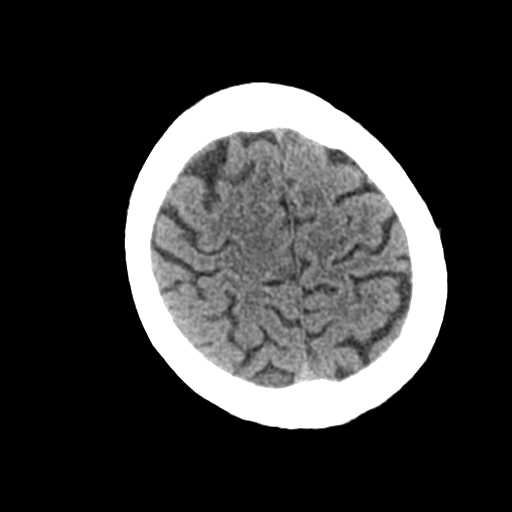
[im 25/30  brain]
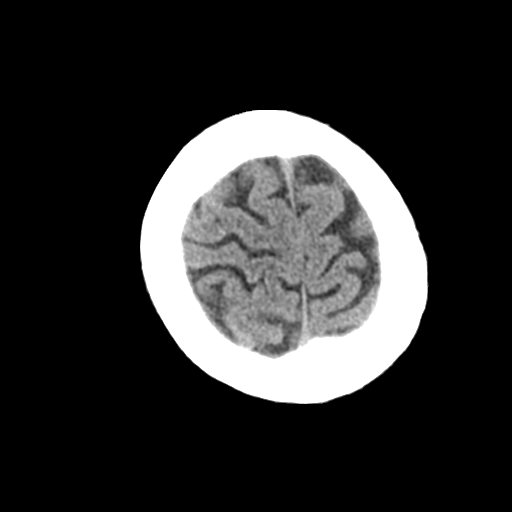
[im 25/30  bone]
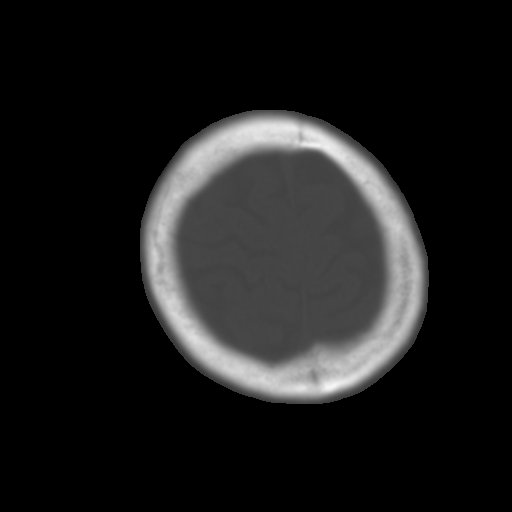
[im 28/30  brain]
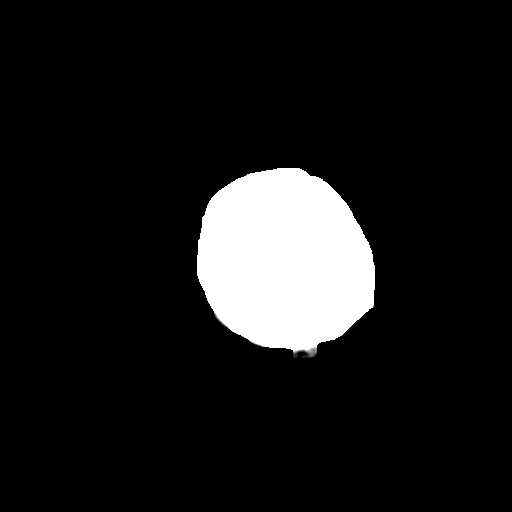

[Series 4: coronal soft · coronal · 0.32mm/px · 3 of 64 slices shown]
[im 22/64  brain]
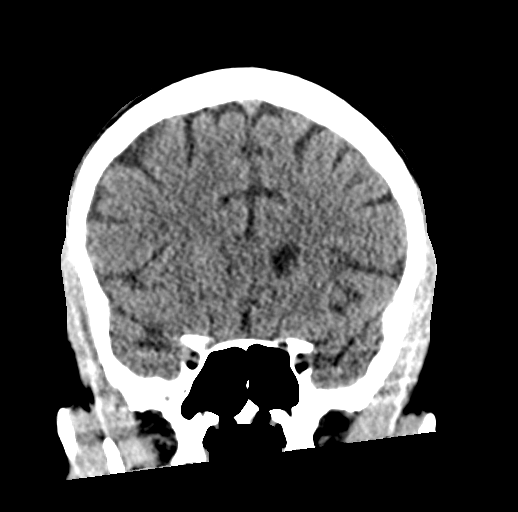
[im 29/64  brain]
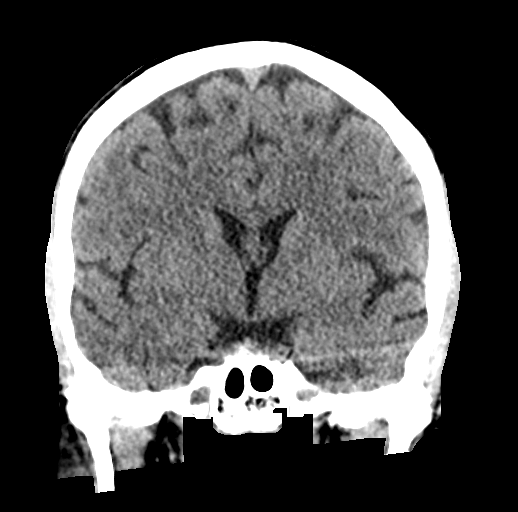
[im 36/64  brain]
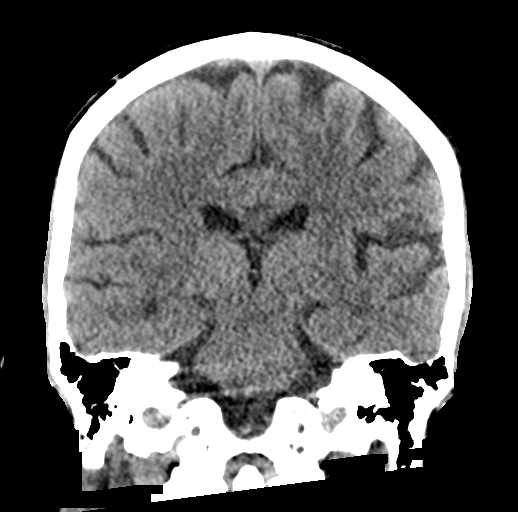

[Series 5: sagittal soft · sagittal · 0.34mm/px · 3 of 53 slices shown]
[im 18/53  brain]
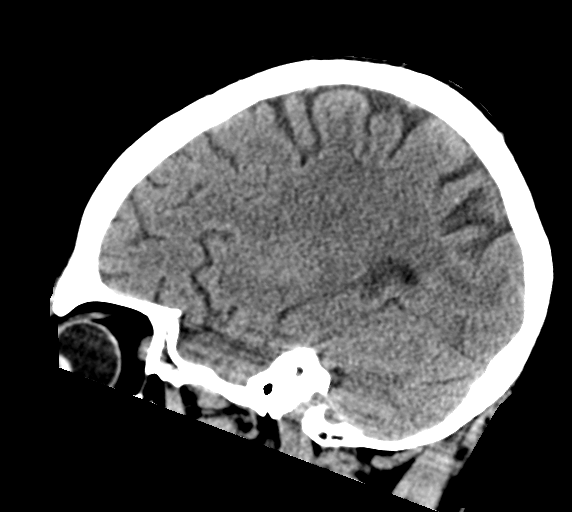
[im 27/53  brain]
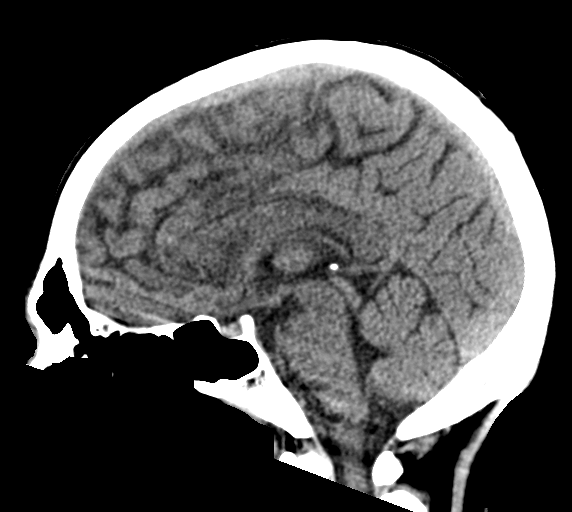
[im 35/53  brain]
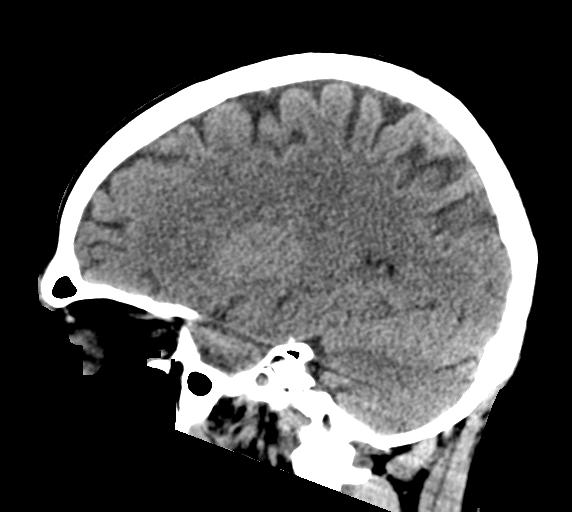

[16 of 47 positions shown; findings below may reference images not displayed]

FINDINGS: Brain: No evidence of acute infarction, hemorrhage, hydrocephalus,
extra-axial collection or mass lesion/mass effect.

Vascular: No hyperdense vessel or unexpected calcification.

Skull: Normal. Negative for fracture or focal lesion.

Sinuses/Orbits: No acute finding.

Other: None.
IMPRESSION: No acute intracranial process.

## 2022-11-27 DIAGNOSIS — R000 Tachycardia, unspecified: Secondary | ICD-10-CM | POA: Diagnosis not present

## 2022-11-27 DIAGNOSIS — K760 Fatty (change of) liver, not elsewhere classified: Secondary | ICD-10-CM | POA: Diagnosis not present

## 2022-11-27 DIAGNOSIS — Z743 Need for continuous supervision: Secondary | ICD-10-CM | POA: Diagnosis not present

## 2022-11-27 DIAGNOSIS — I10 Essential (primary) hypertension: Secondary | ICD-10-CM | POA: Diagnosis not present

## 2022-11-27 DIAGNOSIS — E860 Dehydration: Secondary | ICD-10-CM | POA: Diagnosis not present

## 2022-11-27 DIAGNOSIS — R6510 Systemic inflammatory response syndrome (SIRS) of non-infectious origin without acute organ dysfunction: Secondary | ICD-10-CM | POA: Diagnosis not present

## 2022-11-27 DIAGNOSIS — J9809 Other diseases of bronchus, not elsewhere classified: Secondary | ICD-10-CM | POA: Diagnosis not present

## 2022-11-27 DIAGNOSIS — N179 Acute kidney failure, unspecified: Secondary | ICD-10-CM | POA: Diagnosis not present

## 2022-11-27 DIAGNOSIS — R509 Fever, unspecified: Secondary | ICD-10-CM | POA: Diagnosis not present

## 2022-11-27 DIAGNOSIS — A419 Sepsis, unspecified organism: Secondary | ICD-10-CM | POA: Diagnosis not present

## 2022-11-27 DIAGNOSIS — E861 Hypovolemia: Secondary | ICD-10-CM | POA: Diagnosis not present

## 2022-11-27 DIAGNOSIS — N3289 Other specified disorders of bladder: Secondary | ICD-10-CM | POA: Diagnosis not present

## 2022-11-28 DIAGNOSIS — K561 Intussusception: Secondary | ICD-10-CM | POA: Diagnosis not present

## 2022-11-28 DIAGNOSIS — N179 Acute kidney failure, unspecified: Secondary | ICD-10-CM | POA: Diagnosis not present

## 2022-11-28 DIAGNOSIS — E860 Dehydration: Secondary | ICD-10-CM | POA: Diagnosis not present

## 2022-11-28 DIAGNOSIS — F17210 Nicotine dependence, cigarettes, uncomplicated: Secondary | ICD-10-CM | POA: Diagnosis not present

## 2022-11-28 DIAGNOSIS — Z79899 Other long term (current) drug therapy: Secondary | ICD-10-CM | POA: Diagnosis not present

## 2022-11-28 DIAGNOSIS — F1010 Alcohol abuse, uncomplicated: Secondary | ICD-10-CM | POA: Diagnosis not present

## 2022-11-28 DIAGNOSIS — R509 Fever, unspecified: Secondary | ICD-10-CM | POA: Diagnosis not present

## 2022-11-28 DIAGNOSIS — R7401 Elevation of levels of liver transaminase levels: Secondary | ICD-10-CM | POA: Diagnosis not present

## 2022-11-29 DIAGNOSIS — F1010 Alcohol abuse, uncomplicated: Secondary | ICD-10-CM | POA: Diagnosis not present

## 2022-11-29 DIAGNOSIS — F17210 Nicotine dependence, cigarettes, uncomplicated: Secondary | ICD-10-CM | POA: Diagnosis not present

## 2022-11-29 DIAGNOSIS — R7401 Elevation of levels of liver transaminase levels: Secondary | ICD-10-CM | POA: Diagnosis not present

## 2022-11-29 DIAGNOSIS — F1220 Cannabis dependence, uncomplicated: Secondary | ICD-10-CM | POA: Diagnosis not present

## 2023-08-27 DIAGNOSIS — W06XXXA Fall from bed, initial encounter: Secondary | ICD-10-CM | POA: Diagnosis not present

## 2023-08-27 DIAGNOSIS — S2232XA Fracture of one rib, left side, initial encounter for closed fracture: Secondary | ICD-10-CM | POA: Diagnosis not present

## 2023-08-27 DIAGNOSIS — W19XXXA Unspecified fall, initial encounter: Secondary | ICD-10-CM | POA: Diagnosis not present

## 2023-08-27 DIAGNOSIS — R0781 Pleurodynia: Secondary | ICD-10-CM | POA: Diagnosis not present

## 2023-10-10 ENCOUNTER — Other Ambulatory Visit: Payer: Self-pay

## 2023-10-10 ENCOUNTER — Emergency Department (HOSPITAL_COMMUNITY): Payer: Self-pay

## 2023-10-10 ENCOUNTER — Emergency Department (HOSPITAL_COMMUNITY)
Admission: EM | Admit: 2023-10-10 | Discharge: 2023-10-10 | Disposition: A | Discharge status: Home / Self Care | Payer: 59 | Attending: Emergency Medicine | Admitting: Emergency Medicine

## 2023-10-10 DIAGNOSIS — S299XXA Unspecified injury of thorax, initial encounter: Secondary | ICD-10-CM | POA: Diagnosis present

## 2023-10-10 DIAGNOSIS — Z79899 Other long term (current) drug therapy: Secondary | ICD-10-CM | POA: Diagnosis not present

## 2023-10-10 DIAGNOSIS — I10 Essential (primary) hypertension: Secondary | ICD-10-CM | POA: Insufficient documentation

## 2023-10-10 DIAGNOSIS — W06XXXA Fall from bed, initial encounter: Secondary | ICD-10-CM | POA: Insufficient documentation

## 2023-10-10 DIAGNOSIS — S2232XA Fracture of one rib, left side, initial encounter for closed fracture: Secondary | ICD-10-CM | POA: Diagnosis not present

## 2023-10-10 MED ORDER — OXYCODONE-ACETAMINOPHEN 5-325 MG PO TABS
1.0000 | ORAL_TABLET | Freq: Once | ORAL | Status: AC
  Administered 2023-10-10: 1 via ORAL
  Filled 2023-10-10: qty 1

## 2023-10-10 MED ORDER — AMLODIPINE BESYLATE 5 MG PO TABS: 5.0000 mg | ORAL_TABLET | Freq: Every day | ORAL | 1 refills | Status: AC

## 2023-10-10 MED ORDER — OXYCODONE-ACETAMINOPHEN 5-325 MG PO TABS: ORAL_TABLET | ORAL | 0 refills | Status: AC

## 2023-10-10 NOTE — Discharge Instructions (Signed)
Follow-up with your family doctor in the next couple weeks.  You have been referred to [facility] but you can see anybody want

## 2023-10-10 NOTE — ED Provider Notes (Incomplete)
[place] EMERGENCY DEPARTMENT AT [facility] Provider Note   CSN: [id number] Arrival date & time: 10/10/23  1157     History {Add pertinent medical, surgical, social history, OB history to HPI:1} Chief Complaint  Patient presents with   Rib Injury    Jeff Watts is a 38 y.o. male.  Patient complains of left lateral chest discomfort.  Patient has a history of fractured ribs from 3 weeks ago.  He also has a history of hypertension and not taking medicine now   Chest Pain      Home Medications Prior to Admission medications   Medication Sig Start Date End Date Taking? Authorizing Provider  amLODipine (NORVASC) 5 MG tablet Take 1 tablet (5 mg total) by mouth daily. 10/10/23  Yes Bethann Berkshire, MD  oxyCODONE-acetaminophen (PERCOCET/ROXICET) 5-325 MG tablet Take 1 every 6 hours for pain not relieved by Tylenol or Motrin alone 10/10/23  Yes Bethann Berkshire, MD  chlordiazePOXIDE (LIBRIUM) 5 MG capsule Take 2 tablets by mouth 2 times a day x1 day; then 2 tablets by mouth daily x2 days; then 1 tablet by mouth twice a day x2 days; then 1 tablet daily X 2 days and stop librium. 11/07/20   Vassie Loll, MD  diphenhydrAMINE (BENADRYL) 25 MG tablet Take 25 mg by mouth daily as needed for allergies.    [provider]  losartan (COZAAR) 25 MG tablet Take 1 tablet (25 mg total) by mouth daily. 11/07/20   Vassie Loll, MD  Multiple Vitamin (MULTIVITAMIN WITH MINERALS) TABS tablet Take 1 tablet by mouth daily. 11/08/20   Vassie Loll, MD  nicotine (NICODERM CQ - DOSED IN MG/24 HOURS) 14 mg/24hr patch Place 1 patch (14 mg total) onto the skin daily. 11/08/20   Vassie Loll, MD  ondansetron (ZOFRAN ODT) 4 MG disintegrating tablet Take 1 tablet (4 mg total) by mouth every 8 (eight) hours as needed for nausea or vomiting. 11/07/20   Vassie Loll, MD  potassium chloride SA (KLOR-CON) 20 MEQ tablet Take 1 tablet (20 mEq total) by mouth daily for 2 days. 11/07/20 11/09/20  Vassie Loll, MD  albuterol (PROVENTIL HFA;VENTOLIN HFA) 108 (90 BASE) MCG/ACT inhaler Inhale 2 puffs into the lungs every 6 (six) hours as needed for wheezing or shortness of breath. 05/16/15 05/20/15  Erick Blinks, MD      Allergies    Patient has no known allergies.    Review of Systems   Review of Systems  Cardiovascular:  Positive for chest pain.    Physical Exam Updated Vital Signs BP (!) 155/110   Pulse (!) 115   Temp 99.5 F (37.5 C)   Resp 18   Ht 5\' 3"  (1.6 m)   Wt 56.7 kg   SpO2 99%   BMI 22.14 kg/m  Physical Exam  ED Results / Procedures / Treatments   Labs (all labs ordered are listed, but only abnormal results are displayed) Labs Reviewed - No data to display  EKG None  Radiology DG Ribs Unilateral W/Chest Left Result Date: 10/10/2023 CLINICAL DATA:  rib pain EXAM: LEFT RIBS AND CHEST - 3+ VIEW COMPARISON:  08/27/2023. FINDINGS: Bilateral lung fields are clear. Bilateral costophrenic angles are clear. Normal cardio-mediastinal silhouette. No acute osseous abnormalities. Redemonstration of subacute/healing anterolateral left tenth rib fracture which exhibits callous formation. No acute rib fracture seen. The soft tissues are within normal limits. IMPRESSION: *No active cardiopulmonary disease. Subacute/healing anterolateral left tenth rib fracture. No acute rib fracture seen. Electronically Signed   By:  Jules Schick M.D.   On: 10/10/2023 13:07    Procedures Procedures  {Document cardiac monitor, telemetry assessment procedure when appropriate:1}  Medications Ordered in ED Medications  oxyCODONE-acetaminophen (PERCOCET/ROXICET) 5-325 MG per tablet 1 tablet (has no administration in time range)    ED Course/ Medical Decision Making/ A&P   {   Click here for ABCD2, HEART and other calculatorsREFRESH Note before signing :1}                              Medical Decision Making Risk Prescription drug management.  Patient with fractured left rib and  hypertension meds not treated.  He is placed on Norvasc for the blood pressure and given some Percocet for his pain.  He will follow-up with the primary care doctor  {Document critical care time when appropriate:1} {Document review of labs and clinical decision tools ie heart score, Chads2Vasc2 etc:1}  {Document your independent review of radiology images, and any outside records:1} {Document your discussion with family members, caretakers, and with consultants:1} {Document social determinants of health affecting pt's care:1} {Document your decision making why or why not admission, treatments were needed:1} Final Clinical Impression(s) / ED Diagnoses Final diagnoses:  Closed fracture of one rib of left side, initial encounter  Primary hypertension    Rx / DC Orders ED Discharge Orders          Ordered    oxyCODONE-acetaminophen (PERCOCET/ROXICET) 5-325 MG tablet        10/10/23 1519    amLODipine (NORVASC) 5 MG tablet  Daily        10/10/23 1519

## 2023-10-10 NOTE — ED Triage Notes (Signed)
Pt reported 3 weeks ago he broke a 2 ribs on the left side from rolling off the bed. Pt was evaluated and given pain meds for 2 days. Pt reported pain had been intermittent pain but today pain is unbearable at 10/10
